# Patient Record
Sex: Female | Born: 1950 | Race: Black or African American | Hispanic: No | Smoking: Never smoker
Health system: Southern US, Community
[De-identification: ages and names within clinical notes are randomized; demographics above are authoritative.]

## PROBLEM LIST (undated history)

## (undated) DIAGNOSIS — M1990 Unspecified osteoarthritis, unspecified site: Secondary | ICD-10-CM

## (undated) DIAGNOSIS — J45909 Unspecified asthma, uncomplicated: Secondary | ICD-10-CM

## (undated) DIAGNOSIS — L309 Dermatitis, unspecified: Secondary | ICD-10-CM

## (undated) DIAGNOSIS — K219 Gastro-esophageal reflux disease without esophagitis: Secondary | ICD-10-CM

## (undated) DIAGNOSIS — R0602 Shortness of breath: Secondary | ICD-10-CM

## (undated) DIAGNOSIS — I10 Essential (primary) hypertension: Secondary | ICD-10-CM

## (undated) HISTORY — PX: OTHER SURGICAL HISTORY: SHX169

## (undated) HISTORY — PX: ABDOMINAL HYSTERECTOMY: SHX81

## (undated) HISTORY — PX: TONSILLECTOMY: SUR1361

---

## 1997-08-23 ENCOUNTER — Ambulatory Visit (HOSPITAL_COMMUNITY): Admission: RE | Admit: 1997-08-23 | Discharge: 1997-08-23 | Payer: Self-pay | Admitting: Obstetrics and Gynecology

## 1997-08-28 ENCOUNTER — Ambulatory Visit (HOSPITAL_COMMUNITY): Admission: RE | Admit: 1997-08-28 | Discharge: 1997-08-28 | Payer: Self-pay | Admitting: Obstetrics and Gynecology

## 1997-11-22 ENCOUNTER — Emergency Department (HOSPITAL_COMMUNITY): Admission: EM | Admit: 1997-11-22 | Discharge: 1997-11-22 | Payer: Self-pay | Admitting: Internal Medicine

## 1998-08-29 ENCOUNTER — Encounter: Payer: Self-pay | Admitting: Obstetrics and Gynecology

## 1998-08-29 ENCOUNTER — Ambulatory Visit (HOSPITAL_COMMUNITY): Admission: RE | Admit: 1998-08-29 | Discharge: 1998-08-29 | Payer: Self-pay | Admitting: Obstetrics and Gynecology

## 1998-10-14 ENCOUNTER — Emergency Department (HOSPITAL_COMMUNITY): Admission: EM | Admit: 1998-10-14 | Discharge: 1998-10-14 | Payer: Self-pay | Admitting: Emergency Medicine

## 1999-08-28 ENCOUNTER — Ambulatory Visit (HOSPITAL_COMMUNITY): Admission: RE | Admit: 1999-08-28 | Discharge: 1999-08-28 | Payer: Self-pay | Admitting: Obstetrics and Gynecology

## 1999-08-28 ENCOUNTER — Encounter: Payer: Self-pay | Admitting: Obstetrics and Gynecology

## 1999-09-10 ENCOUNTER — Other Ambulatory Visit: Admission: RE | Admit: 1999-09-10 | Discharge: 1999-09-10 | Payer: Self-pay | Admitting: Obstetrics and Gynecology

## 2000-10-04 ENCOUNTER — Ambulatory Visit (HOSPITAL_COMMUNITY): Admission: RE | Admit: 2000-10-04 | Discharge: 2000-10-04 | Payer: Self-pay | Admitting: Obstetrics and Gynecology

## 2000-10-04 ENCOUNTER — Encounter: Payer: Self-pay | Admitting: Obstetrics and Gynecology

## 2000-11-12 ENCOUNTER — Other Ambulatory Visit: Admission: RE | Admit: 2000-11-12 | Discharge: 2000-11-12 | Payer: Self-pay | Admitting: Obstetrics and Gynecology

## 2001-10-14 ENCOUNTER — Ambulatory Visit (HOSPITAL_COMMUNITY): Admission: RE | Admit: 2001-10-14 | Discharge: 2001-10-14 | Payer: Self-pay | Admitting: Obstetrics and Gynecology

## 2001-10-14 ENCOUNTER — Encounter: Payer: Self-pay | Admitting: Obstetrics and Gynecology

## 2001-10-21 ENCOUNTER — Emergency Department (HOSPITAL_COMMUNITY): Admission: EM | Admit: 2001-10-21 | Discharge: 2001-10-21 | Payer: Self-pay | Admitting: Emergency Medicine

## 2001-10-21 ENCOUNTER — Encounter: Payer: Self-pay | Admitting: Emergency Medicine

## 2002-01-31 ENCOUNTER — Ambulatory Visit (HOSPITAL_BASED_OUTPATIENT_CLINIC_OR_DEPARTMENT_OTHER): Admission: RE | Admit: 2002-01-31 | Discharge: 2002-01-31 | Payer: Self-pay | Admitting: Orthopedic Surgery

## 2002-02-14 ENCOUNTER — Encounter: Admission: RE | Admit: 2002-02-14 | Discharge: 2002-02-28 | Payer: Self-pay | Admitting: Orthopedic Surgery

## 2002-10-30 ENCOUNTER — Ambulatory Visit (HOSPITAL_COMMUNITY): Admission: RE | Admit: 2002-10-30 | Discharge: 2002-10-30 | Payer: Self-pay | Admitting: Obstetrics and Gynecology

## 2002-10-30 ENCOUNTER — Encounter: Payer: Self-pay | Admitting: Obstetrics and Gynecology

## 2003-11-02 ENCOUNTER — Emergency Department (HOSPITAL_COMMUNITY): Admission: EM | Admit: 2003-11-02 | Discharge: 2003-11-03 | Payer: Self-pay | Admitting: Emergency Medicine

## 2003-11-29 ENCOUNTER — Ambulatory Visit (HOSPITAL_COMMUNITY): Admission: RE | Admit: 2003-11-29 | Discharge: 2003-11-29 | Payer: Self-pay | Admitting: Obstetrics and Gynecology

## 2003-12-07 ENCOUNTER — Encounter: Admission: RE | Admit: 2003-12-07 | Discharge: 2003-12-07 | Payer: Self-pay | Admitting: Obstetrics and Gynecology

## 2004-01-11 ENCOUNTER — Emergency Department (HOSPITAL_COMMUNITY): Admission: EM | Admit: 2004-01-11 | Discharge: 2004-01-11 | Payer: Self-pay | Admitting: Emergency Medicine

## 2005-03-10 ENCOUNTER — Ambulatory Visit (HOSPITAL_COMMUNITY): Admission: RE | Admit: 2005-03-10 | Discharge: 2005-03-10 | Payer: Self-pay | Admitting: Internal Medicine

## 2005-09-12 ENCOUNTER — Emergency Department (HOSPITAL_COMMUNITY): Admission: EM | Admit: 2005-09-12 | Discharge: 2005-09-12 | Payer: Self-pay | Admitting: Emergency Medicine

## 2005-10-14 ENCOUNTER — Encounter (INDEPENDENT_AMBULATORY_CARE_PROVIDER_SITE_OTHER): Payer: Self-pay | Admitting: *Deleted

## 2005-10-14 ENCOUNTER — Ambulatory Visit (HOSPITAL_COMMUNITY): Admission: RE | Admit: 2005-10-14 | Discharge: 2005-10-14 | Payer: Self-pay | Admitting: Gastroenterology

## 2006-03-11 ENCOUNTER — Ambulatory Visit (HOSPITAL_COMMUNITY): Admission: RE | Admit: 2006-03-11 | Discharge: 2006-03-11 | Payer: Self-pay | Admitting: Internal Medicine

## 2006-10-25 ENCOUNTER — Emergency Department (HOSPITAL_COMMUNITY): Admission: EM | Admit: 2006-10-25 | Discharge: 2006-10-26 | Payer: Self-pay | Admitting: Emergency Medicine

## 2006-11-13 ENCOUNTER — Emergency Department (HOSPITAL_COMMUNITY): Admission: EM | Admit: 2006-11-13 | Discharge: 2006-11-13 | Payer: Self-pay | Admitting: Emergency Medicine

## 2007-03-15 ENCOUNTER — Ambulatory Visit (HOSPITAL_COMMUNITY): Admission: RE | Admit: 2007-03-15 | Discharge: 2007-03-15 | Payer: Self-pay | Admitting: Internal Medicine

## 2007-08-04 ENCOUNTER — Encounter: Admission: RE | Admit: 2007-08-04 | Discharge: 2007-08-04 | Payer: Self-pay | Admitting: Internal Medicine

## 2008-03-15 ENCOUNTER — Ambulatory Visit (HOSPITAL_COMMUNITY): Admission: RE | Admit: 2008-03-15 | Discharge: 2008-03-15 | Payer: Self-pay | Admitting: Internal Medicine

## 2008-11-19 ENCOUNTER — Emergency Department (HOSPITAL_BASED_OUTPATIENT_CLINIC_OR_DEPARTMENT_OTHER): Admission: EM | Admit: 2008-11-19 | Discharge: 2008-11-19 | Payer: Self-pay | Admitting: Emergency Medicine

## 2008-12-09 ENCOUNTER — Emergency Department (HOSPITAL_COMMUNITY): Admission: EM | Admit: 2008-12-09 | Discharge: 2008-12-09 | Payer: Self-pay | Admitting: Family Medicine

## 2008-12-11 ENCOUNTER — Inpatient Hospital Stay (HOSPITAL_COMMUNITY): Admission: EM | Admit: 2008-12-11 | Discharge: 2008-12-13 | Payer: Self-pay | Admitting: Emergency Medicine

## 2008-12-11 ENCOUNTER — Ambulatory Visit: Payer: Self-pay | Admitting: Cardiovascular Disease

## 2008-12-12 ENCOUNTER — Ambulatory Visit: Payer: Self-pay | Admitting: *Deleted

## 2008-12-12 ENCOUNTER — Encounter (INDEPENDENT_AMBULATORY_CARE_PROVIDER_SITE_OTHER): Payer: Self-pay | Admitting: Internal Medicine

## 2008-12-13 ENCOUNTER — Encounter (INDEPENDENT_AMBULATORY_CARE_PROVIDER_SITE_OTHER): Payer: Self-pay | Admitting: Internal Medicine

## 2008-12-28 ENCOUNTER — Encounter (HOSPITAL_BASED_OUTPATIENT_CLINIC_OR_DEPARTMENT_OTHER): Admission: RE | Admit: 2008-12-28 | Discharge: 2009-01-23 | Payer: Self-pay | Admitting: General Surgery

## 2009-01-08 ENCOUNTER — Encounter: Admission: RE | Admit: 2009-01-08 | Discharge: 2009-01-08 | Payer: Self-pay | Admitting: General Surgery

## 2009-03-22 ENCOUNTER — Ambulatory Visit (HOSPITAL_COMMUNITY): Admission: RE | Admit: 2009-03-22 | Discharge: 2009-03-22 | Payer: Self-pay | Admitting: Internal Medicine

## 2010-03-27 ENCOUNTER — Ambulatory Visit (HOSPITAL_COMMUNITY): Admission: RE | Admit: 2010-03-27 | Discharge: 2010-03-27 | Payer: Self-pay | Admitting: Internal Medicine

## 2010-06-08 ENCOUNTER — Encounter: Payer: Self-pay | Admitting: Obstetrics and Gynecology

## 2010-08-24 LAB — POCT CARDIAC MARKERS
CKMB, poc: 1.6 ng/mL (ref 1.0–8.0)
Myoglobin, poc: 75.9 ng/mL (ref 12–200)
Myoglobin, poc: 79.4 ng/mL (ref 12–200)
Troponin i, poc: <0.05 ng/mL (ref 0.00–0.09)
Troponin i, poc: <0.05 ng/mL (ref 0.00–0.09)
Troponin i, poc: <0.05 ng/mL (ref 0.00–0.09)

## 2010-08-24 LAB — CARDIAC PANEL(CRET KIN+CKTOT+MB+TROPI)
CK, MB: 1.8 ng/mL (ref 0.3–4.0)
CK, MB: 2.5 ng/mL (ref 0.3–4.0)
Relative Index: 1.1 (ref 0.0–2.5)
Relative Index: 1.4 (ref 0.0–2.5)
Total CK: 167 U/L (ref 7–177)
Total CK: 183 U/L — ABNORMAL HIGH (ref 7–177)
Troponin I: 0.02 ng/mL (ref 0.00–0.06)
Troponin I: 0.02 ng/mL (ref 0.00–0.06)

## 2010-08-24 LAB — BASIC METABOLIC PANEL WITH GFR
BUN: 14 mg/dL (ref 6–23)
CO2: 20 meq/L (ref 19–32)
Calcium: 9.6 mg/dL (ref 8.4–10.5)
Chloride: 106 meq/L (ref 96–112)
Creatinine, Ser: 0.98 mg/dL (ref 0.4–1.2)
GFR calc Af Amer: >60 mL/min (ref >60)
GFR calc non Af Amer: 58 mL/min — ABNORMAL LOW (ref >60)
Glucose, Bld: 111 mg/dL — ABNORMAL HIGH (ref 70–99)
Potassium: 5 meq/L (ref 3.5–5.1)
Sodium: 137 meq/L (ref 135–145)

## 2010-08-24 LAB — URINALYSIS, MICROSCOPIC ONLY
Bilirubin Urine: NEGATIVE
Glucose, UA: NEGATIVE mg/dL
Hgb urine dipstick: NEGATIVE
Ketones, ur: NEGATIVE mg/dL
Leukocytes, UA: NEGATIVE
Nitrite: NEGATIVE
Protein, ur: NEGATIVE mg/dL
Specific Gravity, Urine: 1.011 (ref 1.005–1.030)
Urobilinogen, UA: 0.2 mg/dL (ref 0.0–1.0)
pH: 6 (ref 5.0–8.0)

## 2010-08-24 LAB — COMPREHENSIVE METABOLIC PANEL WITH GFR
ALT: 27 U/L (ref 0–35)
AST: 22 U/L (ref 0–37)
Albumin: 3.8 g/dL (ref 3.5–5.2)
Alkaline Phosphatase: 64 U/L (ref 39–117)
BUN: 12 mg/dL (ref 6–23)
CO2: 25 meq/L (ref 19–32)
Calcium: 9.1 mg/dL (ref 8.4–10.5)
Chloride: 107 meq/L (ref 96–112)
Creatinine, Ser: 0.97 mg/dL (ref 0.4–1.2)
GFR calc Af Amer: >60 mL/min (ref >60)
GFR calc non Af Amer: 59 mL/min — ABNORMAL LOW (ref >60)
Glucose, Bld: 152 mg/dL — ABNORMAL HIGH (ref 70–99)
Potassium: 3.5 meq/L (ref 3.5–5.1)
Sodium: 139 meq/L (ref 135–145)
Total Bilirubin: 0.3 mg/dL (ref 0.3–1.2)
Total Protein: 7.3 g/dL (ref 6.0–8.3)

## 2010-08-24 LAB — HEMOGLOBIN A1C
Hgb A1c MFr Bld: 6 % (ref 4.6–6.1)
Mean Plasma Glucose: 126 mg/dL

## 2010-08-24 LAB — LIPID PANEL
Cholesterol: 190 mg/dL (ref 0–200)
HDL: 51 mg/dL (ref >39)
LDL Cholesterol: 110 mg/dL — ABNORMAL HIGH (ref 0–99)
VLDL: 29 mg/dL (ref 0–40)

## 2010-08-24 LAB — CK TOTAL AND CKMB (NOT AT ARMC)
CK, MB: 2.3 ng/mL (ref 0.3–4.0)
Relative Index: 1.3 (ref 0.0–2.5)

## 2010-08-24 LAB — BRAIN NATRIURETIC PEPTIDE: Pro B Natriuretic peptide (BNP): 42.3 pg/mL (ref 0.0–100.0)

## 2010-08-24 LAB — CBC
Platelets: 269 10*3/uL (ref 150–400)
RDW: 16.2 % — ABNORMAL HIGH (ref 11.5–15.5)

## 2010-08-24 LAB — TSH: TSH: 2.05 u[IU]/mL (ref 0.350–4.500)

## 2010-08-24 LAB — TROPONIN I: Troponin I: 0.01 ng/mL (ref 0.00–0.06)

## 2010-08-24 LAB — D-DIMER, QUANTITATIVE: D-Dimer, Quant: 0.99 ug/mL-FEU — ABNORMAL HIGH (ref 0.00–0.48)

## 2010-09-30 NOTE — Assessment & Plan Note (Signed)
[facility]   NAMETIARA, MAULTSBY            ACCOUNT NO.:  [id number]   MEDICAL RECORD NO.:  [id number]      DATE OF BIRTH:  08-26-1950   PHYSICIAN:  Leonie Man, M.D.    VISIT DATE:  12/31/2008                                   OFFICE VISIT   REFERRING PHYSICIAN:  Merlene Laughter. Renae Gloss, MD   PROBLEM:  This is a 60 year old patient presenting with a wounded area  of the left anterior thigh.  The patient complains of severe pruritus  unrelieved by oral and/or topical medications.  She has been scratching  her left leg causing sores and excoriation.  She saw dermatologist who  allegedly performed a biopsy that showed a stasis dermatitis.  She noted  that she has been on oral antipruritic medications, oral and topical  steroids without any significant relief.   The patient is allergic to PENICILLIN, which causes hives and rash.   MEDICAL AND SURGICAL HISTORY:  She is status post left knee arthroscopy,  she has had a left leg wound biopsy, a total abdominal hysterectomy, and  she has controlled hypertension.   CURRENT MEDICATIONS:  1. Doxycycline 100 mg 2 times daily.  2. Amlodipine 5 mg daily.  3. Prednisone was discontinued orally approximately 1 month ago.   SOCIAL HISTORY:  Single black female comes to the clinic unaccompanied.  She speaks [place].  Note, she denies tobacco, alcohol, or illicit drug  use.   REVIEW OF SYSTEMS:  Negative except as outlined above.   PHYSICAL EXAMINATION:  VITAL SIGNS:  Temperature 97, pulse 71,  respirations 21, blood pressure 113/75.  HEENT:  Head is normocephalic.  Pupils round and regular.  Oropharynx is  benign.  NECK:  Eczematous rash over the entire neck.  LUNGS:  Clear to auscultation bilaterally.  HEART:  Regular rate and rhythm without murmurs, rubs, or gallop.  Rhythms heard.  ABDOMEN:  Soft, nontender, nondistended with normoactive bowel sound.  No palpable masses, visceromegaly, or hernias.  EXTREMITIES:  Rash in the right medial thigh which is papular, red, and  pruritic, and questionably eczematous.  On the anterior calf area, there  is excoriation and blacking eschar with evidence of some hemosiderosis.   ASSESSMENT:  1. Venous stasis disease with stasis dermatitis.  2. Eczema.  3. History of hypertension.   PLAN AND DISPOSITION:  1. We will go ahead and get venous Dopplers to rule out greater      saphenous vein and lesser saphenous vein reflux and perforator      incompetence.  2. We will put her on TCA and Unna paste boot.  We will continue      doxycycline.  We will try over-the-counter Benadryl 50 mg q.6 h.      and p.r.n. and at bedtime.  Consideration of SSRIs will be left up      to Dr. Kellie Shropshire, if indeed she wishes to institute this therapy      for the true pruritus.  We will have the patient come back later in      the week to have her Unna boot replaced, if indeed she goes in to      have a venous Doppler study in the coming week.      Luisa Hart  Lurene Shadow, M.D.  Electronically Signed     PB/MEDQ  D:  12/31/2008  T:  01/01/2009  Job:  [id number]   cc:   Merlene Laughter. Renae Gloss, M.D.

## 2010-09-30 NOTE — H&P (Signed)
NAMELELIANA, KONTZ            ACCOUNT NO.:  [id number]   MEDICAL RECORD NO.:  [id number]          PATIENT TYPE:  INP   LOCATION:  0104                         FACILITY:  [facility]   PHYSICIAN:  Peggye Pitt, M.D. DATE OF BIRTH:  25-Jan-1951   DATE OF ADMISSION:  12/11/2008  DATE OF DISCHARGE:                              HISTORY & PHYSICAL   PRIMARY CARE PHYSICIAN:  Dr. Andi Devon.   CHIEF COMPLAINT:  Syncope.   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  Apparently, she was in the a medical ICU today doing some  cleaning when suddenly she felt extremely nauseated and lightheaded and  passed out.  The next thing she remembers is awakening in the emergency  department.  She denies any fever, chills or any extremity weakness.  No  chest pain.  She did feel very short of breath when she became nauseous.  She was recently diagnosed with scabies and was put on hydroxyzine and a  prednisone taper pack.   ALLERGIES:  SHE HAS ALLERGIES TO PENICILLIN WHICH CAUSES HIVES.   PAST MEDICAL HISTORY:  1. Hypertension.  2. Recent history of scabies.   HOME MEDICATIONS:  1. Norvasc 5 mg daily.  2. Hydroxyzine 25 mg every 6 hours as needed for itching.  3. Prednisone taper pack started the day prior to admission.   SOCIAL HISTORY:  She is married and lives with her husband.  No alcohol,  tobacco or illicit drug use.   FAMILY HISTORY:  Noncontributory.   REVIEW OF SYSTEMS:  Negative except as already mentioned in HPI.   PHYSICAL EXAMINATION:  VITAL SIGNS:  Upon admission, blood pressure  162/91, heart rate 101, respirations 20.  O2 sats 97% on 2 liters with a  temperature of 98.5.  GENERAL:  She is alert, awake and oriented x3 in no acute distress.  HEENT:  Normocephalic, atraumatic.  Pupils are equally reactive to light  and accommodation with intact extraocular  movements.  NECK:  Supple.  No JVD, no lymphadenopathy, no bruits, no goiter.  HEART:  Regular rate and rhythm with no murmurs, rubs or gallops.  LUNGS:  Clear to auscultation bilaterally.  ABDOMEN:  Obese, soft, nontender, nondistended with positive bowel  sounds.  EXTREMITIES:  No edema.  Positive pedal pulses.  She does have what  appears to be a chronic venous insufficiency ulcer over her left lower  extremity.  SKIN:  She has multiple papular, excoriated rash over her trunk, groin  area and thighs.  NEUROLOGIC:  Her mental status is intact.  Her cranial nerves II-XII are  intact.  Her muscle strength is 5/5 bilateral and symmetric.  Her DTRs  are 2+ symmetric.  Her sensation is intact to light touch.  Her Babinski  is downgoing bilaterally.  Her finger-to-nose and heel-to-shin are  normal.  She does not have a pronator drift.  I have not ambulated her.   LABS UPON ADMISSION:  Sodium 137, potassium 5.0, chloride 106, bicarb  20, BUN 14, creatinine 0.98 and  glucose of 111.  First set of point of  care markers is negative.  Chest x-ray shows no acute disease.   EKG shows normal sinus rhythm with a rate of about 110, normal axis.  She appears to have some left atrial enlargement.  The EKG machine is  reading anterolateral ST abnormality, however, I do not see ST  depression on her EKG.   ASSESSMENT AND PLAN:  1. For her syncopal event, at this point differential diagnosis is      very broad.  There could be neurologic causes, including vasovagal      syncope or stroke.  We will start with a CT scan of the head.  We      will check carotid Dopplers and 2-D echo.  She may need an MRI      depending on her CT results.  Also possibility is acute coronary      syndrome or some type of arrhythmia, so we will admit her to      telemetry and we will cycle EKGs and cardiac enzymes.  Orthostatic      hypotension is also a possibility.  We will check orthostatic vital      signs.  Further  workup will depend on the results of her pending      studies.  2. For her hypertension, her blood pressure is slightly elevated      today.  We will continue her on her Norvasc.  For prophylaxis while      in the hospital, she will be on Protonix for gastrointestinal      prophylaxis and Lovenox for deep venous thrombosis prophylaxis.      Peggye Pitt, M.D.  Electronically Signed     EH/MEDQ  D:  12/11/2008  T:  12/11/2008  Job:  [id number]   cc:   Merlene Laughter. Renae Gloss, M.D.  Fax: [phone]

## 2010-09-30 NOTE — Discharge Summary (Signed)
NAMESHADAE, REINO            ACCOUNT NO.:  [id number]   MEDICAL RECORD NO.:  [id number]          PATIENT TYPE:  INP   LOCATION:  1407                         FACILITY:  [facility]   PHYSICIAN:  Marcellus Scott, MD     DATE OF BIRTH:  06-21-1950   DATE OF ADMISSION:  12/11/2008  DATE OF DISCHARGE:  12/13/2008                               DISCHARGE SUMMARY   PRIMARY MEDICAL DOCTOR:  Dr. Andi Devon.   DISCHARGE DIAGNOSES:  1. Syncope, ? vasovagal.  2. Dyspnea, ? secondary to anxiety.  3. Hypertension.  4. Asthma, stable.  5. Scabies.  6. Pulmonary nodules.  Outpatient follow-up as deemed necessary.   DISCHARGE MEDICATIONS:  1. Enteric-coated aspirin 81 mg p.o. daily.  2. Prednisone 20 mg p.o. on December 14, 2008, then 10 mg p.o. on December 15, 2008, then stop.  3. Hydroxyzine 25 mg p.o. q. 6 hourly as needed for itching.  4. Albuterol inhaler 2 puffs inhaled p.r.n.  5. Norvasc 5 mg p.o. daily.   DISCONTINUED MEDICATIONS/WITHHELD:  Prednisolone.   PROCEDURES:  1. MRI of the brain without contrast, impression:      a.     No acute intracranial abnormality.      b.     Chronic ischemic sequela to the right superior frontal       gratis, pattern suggestive of right MCA/ACA watershed ischemia,       see elsewhere the brain is within normal limits for age.  2. CT angiogram of the chest with contrast, impression:  No evidence      of pulmonary embolism.  Small hiatal hernia.  Calcified granuloma,      right lower lobe with additional tiny non-calcified right upper      lobe pulmonary nodules.  These are indeterminate in etiology and      require follow-up.  The radiologist recommended that if the patient      is at high risk for bronchogenic carcinoma, follow-up chest x-ray      recommended in 6-12 months.  If the patient is at low risk, then      follow-up CT in 12 months recommended.   1. A CT of the head without contrast, impression:  Negative.  2. Chest x-ray:  No acute  body of cardiopulmonary disease.  3. A 2-D echocardiogram, conclusions:      a.     Left ventricle cavity size was normal.  Wall thickness was       normal.  Systolic function was normal.  Estimated ejection       fraction was in the range of 55-65%.      b.     Aortic valve.  There was very mild stenosis.  Valve 81.5       cm2.      c.     Atrial septum - no defect or patent foraminal valve was       identified.  4. Carotid Dopplers, which showed no internal carotid artery stenosis      bilaterally.   PERTINENT LABS:  D-dimer 0.99.  CBC:  Hemoglobin  13, hematocrit 38.9,  white blood cell 11.9, platelets 269.  Cardiac enzymes cycled and  negative for acute coronary syndrome.  TSH 2.050.  Urinalysis not  suggestive of urinary tract infection.  Hemoglobin A1c 6.  Comprehensive  metabolic panel with glucose of 152, otherwise unremarkable.  BUN 12,  creatinine 0.97.  Lipid panel, except for LDL of 110, was within normal  limits.  BNP 42.3.   CONSULTATIONS:  None.   HOSPITAL COURSE/PATIENT DISPOSITION:  Ms. Roehm is a very pleasant 60-  year-old [place] American female patient.  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  The next thing she remembered was waking up in the  emergency department.  She had some shortness of breath when she became  nauseous.   The patient was admitted for further evaluation and management.   #1.  Syncope.  The patient was admitted to Telemetry.  She remained in  sinus rhythm without any arrhythmia alarms.  Preliminary workup  including carotid Dopplers, echocardiogram and CT of the head were  negative.  Yesterday on interviewing her, she indicated that she had  dizziness both on standing, greater than lying, transient mild vertigo  and headache and nausea.  To rule out posterior circulation stroke, an  MRI was  requested and a posterior circulation CVA was ruled out.  However, it does show some chronic ischemic changes.  Aspirin will hence  be continued.  Today, the patient indicates she is feeling much better  and she indicates she is almost 99% better with no further dyspnea or  vertigo or headache.  She has occasional mild dizziness.  She is  ambulating.  Physical Therapy and Occupational Therapy did not see any  home needs.  The etiology of this could be possibly vasovagal.  Orthostatic blood pressures were checked and negative.   #2.  Dyspnea probably secondary to anxiety.  However, the patient gave a  history of left ankle swelling and pain almost a week ago.  D-dimer was  positive.  This was followed with a CT angiogram of the chest and  pulmonary embolism was ruled out.  She denies any dyspnea for the last  36 hours. Incidental pulmonary nodules were found on CT which need to be  followed as an outpatient as deemed necessary.  #3.  Hypertension.  This needs tighter control as an outpatient.  Her  blood pressure fluctuated between 120s to 150s/70s-80s mmHg.  #4.  Asthma stable.  The patient utilizes as-needed albuterol inhaler  and this is occasional.  #5.  Scabies.  The patient will complete the steroid taper.  She is to  complete the Prometrium cream.  She is to follow-up with her primary MD.  #6.  Left lower extremity chronic numbness and tingling with associated  chronic shin lesions. Although all these findings appear chronic, she  did have some complaints of pain and hence we will obtain lower  extremity venous Dopplers and if these are negative, the patient will be  discharged home today.   The patient has been advised to seek immediate at medical attention if  there is any deterioration of her condition.  She has been advised to  return to work on Monday, December 17, 2008 if she has returned to her  baseline physical  status.  She is advised to follow-up with her primary MD in  the next 5-7  days.  All of her care has been explained to her daughter who is at the  bedside.  They verbalize understanding.   Time taken in coordinating this discharge is 35 minutes.      Marcellus Scott, MD  Electronically Signed     AH/MEDQ  D:  12/13/2008  T:  12/13/2008  Job:  [id number]   cc:   Merlene Laughter. Renae Gloss, M.D.  Fax: [phone]

## 2010-09-30 NOTE — H&P (Signed)
NAMEJANELIS, STELZER NO.:  [id number]   MEDICAL RECORD NO.:  [id number]          PATIENT TYPE:  INP   LOCATION:  0104                         [facility]ACILITY:  [facility]   PHYSICIAN:  Peggye Pitt, M.D. DATE OF BIRTH:  10/19/1950   DATE OF ADMISSION:  12/11/2008  DATE OF DISCHARGE:                              HISTORY & PHYSICAL   ADDENDUM:  This is to follow her assessment and plan:  State that she  has also been recently diagnosed with scabies.  We will give her  Permethrin cream 5%.  Will apply, leave on for 14 hours.  Will also do  hydroxyzine as needed for itching and will continue her prednisone dose  pack.      Peggye Pitt, M.D.  Electronically Signed     EH/MEDQ  D:  12/11/2008  T:  12/11/2008  Job:  [id number]

## 2010-09-30 NOTE — Assessment & Plan Note (Signed)
[facility]   NAME:  Katrina Gardner, Katrina Gardner            ACCOUNT NO.:  [id number]   MEDICAL RECORD NO.:  [id number]      DATE OF BIRTH:  [birth year]   PHYSICIAN:  Leonie Man, M.D.    VISIT DATE:  01/08/2009                                   OFFICE VISIT   PROBLEM:  This patient is a 60 year old woman presenting with an area of  dermatitis on the anterior aspect of the left leg, which we saw on  December 31, 2008.  She also has an associated severe eczema over her  chest and right medial thigh.  It appeared that this might have a  component of venous stasis.  She was then consequently treated with  compression therapy using an Radio broadcast assistant.  She was then sent for a Duplex  venous Doppler studies.  This did not show any evidence of DVT or any  evidence of greater saphenous or lesser saphenous vein insufficiency or  reflux or incompetence of any perforators.  She returns today for  reevaluation.   On examination, the wounded area is now healed.  There is no evidence of  edema.  We will continue her simply on Bag Balm and moisturize it for  her leg.  We will go ahead and discharge from followup from the wound  care center at this time.  I would recommend that she contact a  dermatologist for continued care of her eczema.      Leonie Man, M.D.  Electronically Signed     PB/MEDQ  D:  01/08/2009  T:  01/09/2009  Job:  [id number]

## 2010-10-03 NOTE — Op Note (Signed)
Katrina Gardner, Katrina Gardner            ACCOUNT NO.:  [id number]   MEDICAL RECORD NO.:  [id number]          PATIENT TYPE:  AMB   LOCATION:  ENDO                         FACILITY:  [facility]   PHYSICIAN:  Anselmo Rod, M.D.  DATE OF BIRTH:  Jun 25, 1950   DATE OF PROCEDURE:  10/14/2005  DATE OF DISCHARGE:                                 OPERATIVE REPORT   PROCEDURE PERFORMED:  Colonoscopy with cold biopsies x2 and snare  colonoscopy x2.   ENDOSCOPIST:  Anselmo Rod, M.D.   INSTRUMENT USED:  Olympus video colonoscope.   INDICATIONS FOR PROCEDURE:  A 60 year old African-American female with a  family history of colon cancer in her mother undergoing a screening  colonoscopy to rule out colonic polyps, masses, etc.   PREPROCEDURE PREPARATION:  Informed consent was procured from the patient.  The patient was fasted for 4 hours prior to the procedure and prepped with  OsmoPrep the night of and the morning of the procedure. Risks and benefits  of the procedure including a 10% miss rate of polyps and cancer was  discussed with the patient as well.   PREPROCEDURE PHYSICAL:  VITAL SIGNS:  The patient has stable vital signs.  NECK:  Supple.  CHEST:  Clear to auscultation. S1 and S2 regular.  ABDOMEN:  Soft with normal bowel sounds.   DESCRIPTION OF PROCEDURE:  The patient was placed in a left lateral  decubitus position and sedated with 100 mcg of fentanyl and 10 mg of Versed  in slow incremental doses. Once the patient was adequately sedated and  maintained on low flow oxygen and continuous cardiac monitoring, the Olympus  video colonoscope was advanced from the rectum to the cecum. The appendiceal  orifice and ileocecal valve were clearly visualized and photographed. Two  small sessile polyps were removed and one biopsied from proximal right colon  (cold biopsy x2).  The rest of the exam was unremarkable. Retroflexion in  the rectum revealed no abnormalities. There was no evidence of  diverticulosis. The patient tolerated the procedure well without immediate  complications. There was some residual stool in the colon, and multiple  washings were done.   IMPRESSION:  Two small sessile polyps removed from the cecal base and one  biopsied from the proximal right colon. Otherwise a normal exam.   RECOMMENDATIONS:  1.  Await pathology results.  2.  Avoid all nonsteroidals including aspirin for the next two weeks.  3.  Repeat colonoscopy depending on pathology results.  4.  Outpatient followup as need arises in the future.      Anselmo Rod, M.D.  Electronically Signed     JNM/MEDQ  D:  10/14/2005  T:  10/14/2005  Job:  [id number]   cc:   Merlene Laughter. Renae Gloss, M.D.  Fax: [phone]

## 2010-10-03 NOTE — Op Note (Signed)
   NAMELEKEYA, ROLLINGS                      ACCOUNT NO.:  [id number]   MEDICAL RECORD NO.:  [id number]                   PATIENT TYPE:  AMB   LOCATION:  DSC                                  FACILITY:  [facility]   PHYSICIAN:  Rodney A. Chaney Malling, M.D.           DATE OF BIRTH:  1950/11/12   DATE OF PROCEDURE:  01/31/2002  DATE OF DISCHARGE:                                 OPERATIVE REPORT   PREOPERATIVE DIAGNOSIS:  Tear of medial meniscus, left knee.   POSTOPERATIVE DIAGNOSIS:  Tear of medial meniscus.   PROCEDURE:  Arthroscopy and debridement of posterior horn medial meniscus,  left knee.   SURGEON:  Lenard Galloway. Chaney Malling, M.D.   ANESTHESIA:  General.   PATHOLOGY:  With the arthroscope in the knee, a very careful examination of  both compartments was undertaken.  The patellofemoral joint appeared fairly  normal.  With the arthroscope in the lateral compartment, the articular  cartilage of the lateral femoral condyle and lateral tibial plateau and the  entire circumference of the lateral meniscus was normal.  The arthroscope  was then passed into the medial compartment.  The articular cartilage of the  medial femoral condyle and medial tibial plateau appeared fairly normal, but  there were multiple horizontal tears of the posterior horn of the medial  meniscus.   DESCRIPTION OF PROCEDURE:  The patient was placed on the operating table in  the supine position with a pneumatic tourniquet about the left upper thigh.  The left leg was placed in the leg holder and the entire left lower  extremity prepped with Duraprep and draped out in the usual manner.  An  infusion cannula was placed in the superior medial pouch and the knee  distended with saline.  Anteromedial and anterolateral portals were made,  and the arthroscope was introduced.  All the pathology seen in the medial  compartment with tears of the posterior horn of the medial meniscus.  The  baskets were inserted and the  posterior horn was extensively debrided.  This  was followed up with the intra-articular shaver, and all debris was removed.  The remainder of the meniscus was smoothly balanced with a nice transition  to the mid-third of the medial meniscus.  The knee was then filled with  Marcaine.  A large bulky compression dressing was applied.  The patient  returned to the recovery room in excellent condition.  Technically this  procedure went extremely well.   FOLLOW-UP CARE:  1. To my office on Monday.  2. Percocet for pain.                                               Rodney A. Chaney Malling, M.D.    RAM/MEDQ  D:  01/31/2002  T:  02/01/2002  Job:  [id number]

## 2011-03-31 ENCOUNTER — Other Ambulatory Visit (HOSPITAL_COMMUNITY): Payer: Self-pay | Admitting: Internal Medicine

## 2011-03-31 DIAGNOSIS — Z1231 Encounter for screening mammogram for malignant neoplasm of breast: Secondary | ICD-10-CM

## 2011-05-14 ENCOUNTER — Ambulatory Visit (HOSPITAL_COMMUNITY): Payer: Self-pay

## 2011-05-14 ENCOUNTER — Ambulatory Visit (HOSPITAL_COMMUNITY)
Admission: RE | Admit: 2011-05-14 | Discharge: 2011-05-14 | Disposition: A | Discharge status: Home / Self Care | Payer: 59 | Source: Ambulatory Visit | Attending: Internal Medicine | Admitting: Internal Medicine

## 2011-05-14 DIAGNOSIS — Z1231 Encounter for screening mammogram for malignant neoplasm of breast: Secondary | ICD-10-CM | POA: Insufficient documentation

## 2012-02-12 ENCOUNTER — Encounter (HOSPITAL_COMMUNITY): Payer: Self-pay | Admitting: Pharmacy Technician

## 2012-02-16 ENCOUNTER — Encounter (HOSPITAL_COMMUNITY)
Admission: RE | Admit: 2012-02-16 | Discharge: 2012-02-16 | Disposition: A | Discharge status: Home / Self Care | Payer: 59 | Source: Ambulatory Visit | Attending: Orthopedic Surgery | Admitting: Orthopedic Surgery

## 2012-02-16 ENCOUNTER — Encounter (HOSPITAL_COMMUNITY): Payer: Self-pay

## 2012-02-16 HISTORY — DX: Unspecified asthma, uncomplicated: J45.909

## 2012-02-16 HISTORY — DX: Unspecified osteoarthritis, unspecified site: M19.90

## 2012-02-16 HISTORY — DX: Gastro-esophageal reflux disease without esophagitis: K21.9

## 2012-02-16 HISTORY — DX: Essential (primary) hypertension: I10

## 2012-02-16 LAB — BASIC METABOLIC PANEL
BUN: 9 mg/dL (ref 6–23)
CO2: 28 mEq/L (ref 19–32)
Calcium: 9.7 mg/dL (ref 8.4–10.5)
Creatinine, Ser: 0.84 mg/dL (ref 0.50–1.10)
GFR calc Af Amer: 85 mL/min — ABNORMAL LOW (ref >90)

## 2012-02-16 LAB — CBC
MCHC: 35 g/dL (ref 30.0–36.0)
MCV: 80.2 fL (ref 78.0–100.0)
Platelets: 277 10*3/uL (ref 150–400)
RDW: 15 % (ref 11.5–15.5)
WBC: 8.4 10*3/uL (ref 4.0–10.5)

## 2012-02-16 LAB — SURGICAL PCR SCREEN
MRSA, PCR: NEGATIVE
Staphylococcus aureus: POSITIVE — AB

## 2012-02-16 LAB — TYPE AND SCREEN

## 2012-02-16 NOTE — Pre-Procedure Instructions (Signed)
20 Katrina Gardner  02/16/2012   Your procedure is scheduled on:  02-18-2012  Report to [facility] at 8:00 AM.  Call this number if you have problems the morning of surgery: [phone]   Remember:   Do not eat food or drink:After Midnight.      Take these medicines the morning of surgery with A SIP OF WATER: Amlodipine(Norvasc)   Do not wear jewelry, make-up or nail polish.  Do not wear lotions, powders, or perfumes. You may wear deodorant.  Do not shave 48 hours prior to surgery. Men may shave face and neck.  Do not bring valuables to the hospital.  Contacts, dentures or bridgework may not be worn into surgery.  Leave suitcase in the car. After surgery it may be brought to your room.    For patients admitted to the hospital, checkout time is 11:00 AM the day of discharge.      Special Instructions: Shower using CHG 2 nights before surgery and the night before surgery.  If you shower the day of surgery use CHG.  Use special wash - you have one bottle of CHG for all showers.  You should use approximately 1/3 of the bottle for each shower.   Please read over the following fact sheets that you were given: Pain Booklet, Coughing and Deep Breathing, Blood Transfusion Information, MRSA Information and Surgical Site Infection Prevention

## 2012-02-16 NOTE — Progress Notes (Signed)
Ralene Bathe PA paged [phone] . Pt. Has penicillin allergy and has ancef ordered pre-op.

## 2012-02-17 MED ORDER — LACTATED RINGERS IV SOLN
INTRAVENOUS | Status: DC
  Administered 2012-02-18 (×2): via INTRAVENOUS

## 2012-02-17 MED ORDER — CHLORHEXIDINE GLUCONATE 4 % EX LIQD: 60.0000 mL | Freq: Once | CUTANEOUS | Status: DC

## 2012-02-17 MED ORDER — CEFAZOLIN SODIUM-DEXTROSE 2-3 GM-% IV SOLR
2.0000 g | INTRAVENOUS | Status: AC
  Administered 2012-02-18: 2 g via INTRAVENOUS
  Filled 2012-02-17: qty 50

## 2012-02-17 NOTE — Consult Note (Signed)
Anesthesia Chart Review:  Patient is a 61 year old female scheduled for right shoulder reversal arthroplasty by Dr. Rennis Chris on 02/18/12. History includes nonsmoker, obesity, hypertension, GERD, arthritis, asthma.  PCP is Dr. Andi Devon who has cleared patient for this procedure.  EKG from [facility] ([facility]) showed NSR.  The date is unclear--? 02/10/10, although last office note stated that an EKG was ordered on 02/09/12.  I've left a message with [facility] asking them to clarify the date of her EKG or refax to Short Stay.  If her EKG is not within the past year, then it will need to be repeated on the day of surgery.  Echo on 12/12/08 showed: 1. Left ventricle: The cavity size was normal. Wall thickness was normal. Systolic function was normal. The estimated ejection fraction was in the range of 55% to 65%. 2. Aortic valve: There was very mild stenosis. Valve area: 1.55cm^2(VTI). Valve area: 1.43cm^2 (Vmax). 3. Atrial septum: No defect or patent foramen ovale was identified.  CXR on 02/16/12 showed no active disease.  Labs noted.  Shonna Chock, PA-C 02/17/12 4401  Update: 02/17/12 1640  I got a message from Bebe Liter, NP confirming that patient was cleared for surgery, but she did not leave the date of the EKG on my voicemail.  I left another message asking their office to refax patient's latest EKG with the date clarified to [phone].  Short Stay staff will have to follow-up in the morning.

## 2012-02-18 ENCOUNTER — Encounter (HOSPITAL_COMMUNITY): Payer: Self-pay | Admitting: *Deleted

## 2012-02-18 ENCOUNTER — Encounter (HOSPITAL_COMMUNITY)
Admission: RE | Disposition: A | Discharge status: Home / Self Care | Payer: Self-pay | Source: Ambulatory Visit | Attending: Orthopedic Surgery

## 2012-02-18 ENCOUNTER — Inpatient Hospital Stay (HOSPITAL_COMMUNITY)
Admission: RE | Admit: 2012-02-18 | Discharge: 2012-02-19 | DRG: 484 | Disposition: A | Discharge status: Home / Self Care | Payer: 59 | Source: Ambulatory Visit | Attending: Orthopedic Surgery | Admitting: Orthopedic Surgery

## 2012-02-18 ENCOUNTER — Encounter (HOSPITAL_COMMUNITY): Payer: Self-pay | Admitting: Vascular Surgery

## 2012-02-18 ENCOUNTER — Ambulatory Visit (HOSPITAL_COMMUNITY): Payer: 59 | Admitting: Vascular Surgery

## 2012-02-18 DIAGNOSIS — K219 Gastro-esophageal reflux disease without esophagitis: Secondary | ICD-10-CM | POA: Diagnosis present

## 2012-02-18 DIAGNOSIS — I10 Essential (primary) hypertension: Secondary | ICD-10-CM

## 2012-02-18 DIAGNOSIS — M719 Bursopathy, unspecified: Principal | ICD-10-CM | POA: Diagnosis present

## 2012-02-18 DIAGNOSIS — Z791 Long term (current) use of non-steroidal anti-inflammatories (NSAID): Secondary | ICD-10-CM

## 2012-02-18 DIAGNOSIS — Z91040 Latex allergy status: Secondary | ICD-10-CM

## 2012-02-18 DIAGNOSIS — M67919 Unspecified disorder of synovium and tendon, unspecified shoulder: Principal | ICD-10-CM | POA: Diagnosis present

## 2012-02-18 DIAGNOSIS — M12819 Other specific arthropathies, not elsewhere classified, unspecified shoulder: Secondary | ICD-10-CM

## 2012-02-18 DIAGNOSIS — M75100 Unspecified rotator cuff tear or rupture of unspecified shoulder, not specified as traumatic: Secondary | ICD-10-CM

## 2012-02-18 DIAGNOSIS — Z880 Allergy status to penicillin: Secondary | ICD-10-CM

## 2012-02-18 HISTORY — DX: Dermatitis, unspecified: L30.9

## 2012-02-18 HISTORY — PX: ROTATOR CUFF REPAIR: SHX139

## 2012-02-18 HISTORY — DX: Shortness of breath: R06.02

## 2012-02-18 HISTORY — PX: REVERSE SHOULDER ARTHROPLASTY: SHX5054

## 2012-02-18 SURGERY — ARTHROPLASTY, SHOULDER, TOTAL, REVERSE
Anesthesia: General | Site: Shoulder | Laterality: Right | Wound class: Clean

## 2012-02-18 MED ORDER — TEMAZEPAM 15 MG PO CAPS: 15.0000 mg | ORAL_CAPSULE | Freq: Every evening | ORAL | Status: DC | PRN

## 2012-02-18 MED ORDER — DOCUSATE SODIUM 100 MG PO CAPS
100.0000 mg | ORAL_CAPSULE | Freq: Two times a day (BID) | ORAL | Status: DC
  Administered 2012-02-18 – 2012-02-19 (×3): 100 mg via ORAL
  Filled 2012-02-18 (×4): qty 1

## 2012-02-18 MED ORDER — ONDANSETRON HCL 4 MG/2ML IJ SOLN
4.0000 mg | Freq: Four times a day (QID) | INTRAMUSCULAR | Status: DC | PRN
  Administered 2012-02-19: 4 mg via INTRAVENOUS
  Filled 2012-02-18: qty 2

## 2012-02-18 MED ORDER — MUPIROCIN 2 % EX OINT
TOPICAL_OINTMENT | Freq: Two times a day (BID) | CUTANEOUS | Status: DC
  Administered 2012-02-18 – 2012-02-19 (×3): via NASAL
  Filled 2012-02-18: qty 22

## 2012-02-18 MED ORDER — PROPOFOL 10 MG/ML IV BOLUS
INTRAVENOUS | Status: DC | PRN
  Administered 2012-02-18: 140 mg via INTRAVENOUS

## 2012-02-18 MED ORDER — MIDAZOLAM HCL 2 MG/2ML IJ SOLN
2.0000 mg | INTRAMUSCULAR | Status: DC | PRN
  Administered 2012-02-18: 2 mg via INTRAVENOUS

## 2012-02-18 MED ORDER — NEOSTIGMINE METHYLSULFATE 1 MG/ML IJ SOLN
INTRAMUSCULAR | Status: DC | PRN
  Administered 2012-02-18: 2 mg via INTRAVENOUS

## 2012-02-18 MED ORDER — ALUM & MAG HYDROXIDE-SIMETH 200-200-20 MG/5ML PO SUSP: 30.0000 mL | ORAL | Status: DC | PRN

## 2012-02-18 MED ORDER — ONDANSETRON HCL 4 MG/2ML IJ SOLN
INTRAMUSCULAR | Status: DC | PRN
  Administered 2012-02-18: 4 mg via INTRAVENOUS

## 2012-02-18 MED ORDER — ACETAMINOPHEN 650 MG RE SUPP: 650.0000 mg | Freq: Four times a day (QID) | RECTAL | Status: DC | PRN

## 2012-02-18 MED ORDER — INFLUENZA VIRUS VACC SPLIT PF IM SUSP
0.5000 mL | INTRAMUSCULAR | Status: AC
  Administered 2012-02-19: 0.5 mL via INTRAMUSCULAR
  Filled 2012-02-18: qty 0.5

## 2012-02-18 MED ORDER — PHENYLEPHRINE HCL 10 MG/ML IJ SOLN
INTRAMUSCULAR | Status: DC | PRN
  Administered 2012-02-18 (×3): 80 ug via INTRAVENOUS

## 2012-02-18 MED ORDER — HYDROMORPHONE HCL PF 1 MG/ML IJ SOLN: 0.2500 mg | INTRAMUSCULAR | Status: DC | PRN

## 2012-02-18 MED ORDER — FENTANYL CITRATE 0.05 MG/ML IJ SOLN
100.0000 ug | Freq: Once | INTRAMUSCULAR | Status: AC
  Administered 2012-02-18: 100 ug via INTRAVENOUS

## 2012-02-18 MED ORDER — ROCURONIUM BROMIDE 100 MG/10ML IV SOLN
INTRAVENOUS | Status: DC | PRN
  Administered 2012-02-18: 40 mg via INTRAVENOUS

## 2012-02-18 MED ORDER — MENTHOL 3 MG MT LOZG
1.0000 | LOZENGE | OROMUCOSAL | Status: DC | PRN
  Administered 2012-02-19: 3 mg via ORAL
  Filled 2012-02-18 (×2): qty 9

## 2012-02-18 MED ORDER — CEFAZOLIN SODIUM 1-5 GM-% IV SOLN
1.0000 g | Freq: Four times a day (QID) | INTRAVENOUS | Status: AC
  Administered 2012-02-18 – 2012-02-19 (×3): 1 g via INTRAVENOUS
  Filled 2012-02-18 (×5): qty 50

## 2012-02-18 MED ORDER — FENTANYL CITRATE 0.05 MG/ML IJ SOLN
INTRAMUSCULAR | Status: DC | PRN
  Administered 2012-02-18 (×2): 100 ug via INTRAVENOUS

## 2012-02-18 MED ORDER — ACETAMINOPHEN 10 MG/ML IV SOLN: 1000.0000 mg | Freq: Once | INTRAVENOUS | Status: DC | PRN

## 2012-02-18 MED ORDER — MIDAZOLAM HCL 2 MG/2ML IJ SOLN
INTRAMUSCULAR | Status: AC
  Filled 2012-02-18: qty 2

## 2012-02-18 MED ORDER — DIPHENHYDRAMINE HCL 12.5 MG/5ML PO ELIX
12.5000 mg | ORAL_SOLUTION | ORAL | Status: DC | PRN
Start: 2012-02-18 — End: 2012-02-19

## 2012-02-18 MED ORDER — ONDANSETRON HCL 4 MG PO TABS: 4.0000 mg | ORAL_TABLET | Freq: Four times a day (QID) | ORAL | Status: DC | PRN

## 2012-02-18 MED ORDER — DEXAMETHASONE SODIUM PHOSPHATE 4 MG/ML IJ SOLN
INTRAMUSCULAR | Status: DC | PRN
  Administered 2012-02-18: 4 mg via INTRAVENOUS

## 2012-02-18 MED ORDER — PHENOL 1.4 % MT LIQD: 1.0000 | OROMUCOSAL | Status: DC | PRN

## 2012-02-18 MED ORDER — KETOROLAC TROMETHAMINE 15 MG/ML IJ SOLN
15.0000 mg | Freq: Four times a day (QID) | INTRAMUSCULAR | Status: DC
  Administered 2012-02-18 – 2012-02-19 (×5): 15 mg via INTRAVENOUS
  Filled 2012-02-18 (×10): qty 1

## 2012-02-18 MED ORDER — FENTANYL CITRATE 0.05 MG/ML IJ SOLN: 100.0000 ug | INTRAMUSCULAR | Status: DC | PRN

## 2012-02-18 MED ORDER — KETOROLAC TROMETHAMINE 30 MG/ML IJ SOLN
INTRAMUSCULAR | Status: AC
  Filled 2012-02-18: qty 1

## 2012-02-18 MED ORDER — ONDANSETRON HCL 4 MG/2ML IJ SOLN: 4.0000 mg | Freq: Once | INTRAMUSCULAR | Status: DC | PRN

## 2012-02-18 MED ORDER — POLYETHYLENE GLYCOL 3350 17 G PO PACK: 17.0000 g | PACK | Freq: Every day | ORAL | Status: DC | PRN

## 2012-02-18 MED ORDER — LIDOCAINE HCL 4 % MT SOLN
OROMUCOSAL | Status: DC | PRN
  Administered 2012-02-18: 4 mL via TOPICAL

## 2012-02-18 MED ORDER — OXYCODONE-ACETAMINOPHEN 5-325 MG PO TABS
1.0000 | ORAL_TABLET | ORAL | Status: DC | PRN
  Administered 2012-02-19 (×2): 2 via ORAL
  Filled 2012-02-18 (×2): qty 2

## 2012-02-18 MED ORDER — EPHEDRINE SULFATE 50 MG/ML IJ SOLN
INTRAMUSCULAR | Status: DC | PRN
  Administered 2012-02-18 (×2): 10 mg via INTRAVENOUS

## 2012-02-18 MED ORDER — DIPHENHYDRAMINE HCL 50 MG/ML IJ SOLN
INTRAMUSCULAR | Status: DC | PRN
  Administered 2012-02-18: 50 mg via INTRAVENOUS

## 2012-02-18 MED ORDER — METOCLOPRAMIDE HCL 5 MG/ML IJ SOLN
5.0000 mg | Freq: Three times a day (TID) | INTRAMUSCULAR | Status: DC | PRN
Start: 2012-02-18 — End: 2012-02-19

## 2012-02-18 MED ORDER — GLYCOPYRROLATE 0.2 MG/ML IJ SOLN
INTRAMUSCULAR | Status: DC | PRN
  Administered 2012-02-18: .4 mg via INTRAVENOUS

## 2012-02-18 MED ORDER — ACETAMINOPHEN 325 MG PO TABS: 650.0000 mg | ORAL_TABLET | Freq: Four times a day (QID) | ORAL | Status: DC | PRN

## 2012-02-18 MED ORDER — METOCLOPRAMIDE HCL 10 MG PO TABS
5.0000 mg | ORAL_TABLET | Freq: Three times a day (TID) | ORAL | Status: DC | PRN
Start: 2012-02-18 — End: 2012-02-19

## 2012-02-18 MED ORDER — HYDROMORPHONE HCL PF 1 MG/ML IJ SOLN
0.5000 mg | INTRAMUSCULAR | Status: DC | PRN
  Administered 2012-02-18 – 2012-02-19 (×3): 1 mg via INTRAVENOUS
  Filled 2012-02-18 (×4): qty 1

## 2012-02-18 MED ORDER — LACTATED RINGERS IV SOLN
INTRAVENOUS | Status: DC
  Administered 2012-02-18 – 2012-02-19 (×2): via INTRAVENOUS

## 2012-02-18 MED ORDER — SODIUM CHLORIDE 0.9 % IR SOLN
Status: DC | PRN
  Administered 2012-02-18: 1000 mL

## 2012-02-18 MED ORDER — AMLODIPINE BESYLATE 5 MG PO TABS
5.0000 mg | ORAL_TABLET | Freq: Every day | ORAL | Status: DC
  Administered 2012-02-19: 5 mg via ORAL
  Filled 2012-02-18 (×2): qty 1

## 2012-02-18 MED ORDER — MIDAZOLAM HCL 5 MG/ML IJ SOLN: 2.0000 mg | Freq: Once | INTRAMUSCULAR | Status: DC

## 2012-02-18 MED ORDER — FENTANYL CITRATE 0.05 MG/ML IJ SOLN
INTRAMUSCULAR | Status: AC
  Filled 2012-02-18: qty 2

## 2012-02-18 MED ORDER — PNEUMOCOCCAL VAC POLYVALENT 25 MCG/0.5ML IJ INJ
0.5000 mL | INJECTION | INTRAMUSCULAR | Status: AC
  Administered 2012-02-19: 0.5 mL via INTRAMUSCULAR
  Filled 2012-02-18: qty 0.5

## 2012-02-18 SURGICAL SUPPLY — 81 items
BIT DRILL 170X2.5X (BIT) IMPLANT
BIT DRL 170X2.5X (BIT)
BLADE SAW SGTL 83.5X18.5 (BLADE) ×2 IMPLANT
BRUSH FEMORAL CANAL (MISCELLANEOUS) IMPLANT
CEMENT BONE DEPUY (Cement) ×3 IMPLANT
CLOTH BEACON ORANGE TIMEOUT ST (SAFETY) ×2 IMPLANT
CLSR STERI-STRIP ANTIMIC 1/2X4 (GAUZE/BANDAGES/DRESSINGS) ×1 IMPLANT
COVER SURGICAL LIGHT HANDLE (MISCELLANEOUS) ×2 IMPLANT
DRAPE INCISE IOBAN 66X45 STRL (DRAPES) ×2 IMPLANT
DRAPE SURG 17X11 SM STRL (DRAPES) ×2 IMPLANT
DRAPE U-SHAPE 47X51 STRL (DRAPES) ×2 IMPLANT
DRILL 2.5 (BIT)
DRILL BIT 7/64X5 (BIT) ×1 IMPLANT
DRSG AQUACEL AG ADV 3.5X10 (GAUZE/BANDAGES/DRESSINGS) ×1 IMPLANT
DRSG MEPILEX BORDER 4X8 (GAUZE/BANDAGES/DRESSINGS) ×1 IMPLANT
DURAPREP 26ML APPLICATOR (WOUND CARE) ×2 IMPLANT
ELECT BLADE 4.0 EZ CLEAN MEGAD (MISCELLANEOUS)
ELECT CAUTERY BLADE 6.4 (BLADE) ×2 IMPLANT
ELECT REM PT RETURN 9FT ADLT (ELECTROSURGICAL) ×2
ELECTRODE BLDE 4.0 EZ CLN MEGD (MISCELLANEOUS) ×1 IMPLANT
ELECTRODE REM PT RTRN 9FT ADLT (ELECTROSURGICAL) ×1 IMPLANT
FACESHIELD LNG OPTICON STERILE (SAFETY) ×6 IMPLANT
GLOVE BIO SURGEON STRL SZ7.5 (GLOVE) ×1 IMPLANT
GLOVE BIO SURGEON STRL SZ8 (GLOVE) ×1 IMPLANT
GLOVE BIOGEL PI IND STRL 8 (GLOVE) IMPLANT
GLOVE BIOGEL PI INDICATOR 8 (GLOVE) ×1
GLOVE BIOGEL PI ORTHO PRO 7.5 (GLOVE) ×1
GLOVE BIOGEL PI ORTHO PRO SZ7 (GLOVE) ×1
GLOVE EUDERMIC 7 POWDERFREE (GLOVE) ×1 IMPLANT
GLOVE PI ORTHO PRO STRL 7.5 (GLOVE) IMPLANT
GLOVE PI ORTHO PRO STRL SZ7 (GLOVE) IMPLANT
GLOVE SS BIOGEL STRL SZ 7.5 (GLOVE) ×1 IMPLANT
GLOVE SUPERSENSE BIOGEL SZ 7.5 (GLOVE)
GLOVE SURG SS PI 7.0 STRL IVOR (GLOVE) ×1 IMPLANT
GLOVE SURG SS PI 7.5 STRL IVOR (GLOVE) ×1 IMPLANT
GLOVE SURG SS PI 8.0 STRL IVOR (GLOVE) ×1 IMPLANT
GLOVE SURG SS PI 8.5 STRL IVOR (GLOVE) ×1
GLOVE SURG SS PI 8.5 STRL STRW (GLOVE) IMPLANT
GOWN STRL NON-REIN LRG LVL3 (GOWN DISPOSABLE) ×1 IMPLANT
GOWN STRL REIN XL XLG (GOWN DISPOSABLE) ×5 IMPLANT
HANDPIECE INTERPULSE COAX TIP (DISPOSABLE)
KIT BASIN OR (CUSTOM PROCEDURE TRAY) ×2 IMPLANT
KIT ROOM TURNOVER OR (KITS) ×2 IMPLANT
MANIFOLD NEPTUNE II (INSTRUMENTS) ×2 IMPLANT
NDL HYPO 25GX1X1/2 BEV (NEEDLE) IMPLANT
NDL SUT 6 .5 CRC .975X.05 MAYO (NEEDLE) IMPLANT
NEEDLE HYPO 25GX1X1/2 BEV (NEEDLE) IMPLANT
NEEDLE MAYO TAPER (NEEDLE) ×2
NS IRRIG 1000ML POUR BTL (IV SOLUTION) ×2 IMPLANT
PACK SHOULDER (CUSTOM PROCEDURE TRAY) ×2 IMPLANT
PAD ARMBOARD 7.5X6 YLW CONV (MISCELLANEOUS) ×4 IMPLANT
PASSER SUT SWANSON 36MM LOOP (INSTRUMENTS) IMPLANT
PIN GUIDE 1.2 (PIN) IMPLANT
PIN GUIDE GLENOPHERE 1.5MX300M (PIN) IMPLANT
PIN METAGLENE 2.5 (PIN) IMPLANT
PRESSURIZER FEMORAL UNIV (MISCELLANEOUS) IMPLANT
RESTRICTOR CEMENT PE SZ 2 (Cement) ×1 IMPLANT
SET HNDPC FAN SPRY TIP SCT (DISPOSABLE) IMPLANT
SLING ARM FOAM STRAP LRG (SOFTGOODS) ×1 IMPLANT
SLING ARM IMMOBILIZER LRG (SOFTGOODS) ×2 IMPLANT
SPONGE LAP 18X18 X RAY DECT (DISPOSABLE) ×3 IMPLANT
SPONGE LAP 4X18 X RAY DECT (DISPOSABLE) ×1 IMPLANT
STRIP CLOSURE SKIN 1/2X4 (GAUZE/BANDAGES/DRESSINGS) ×1 IMPLANT
SUCTION FRAZIER TIP 10 FR DISP (SUCTIONS) ×2 IMPLANT
SUT BONE WAX W31G (SUTURE) IMPLANT
SUT FIBERWIRE #2 38 T-5 BLUE (SUTURE) ×4
SUT MNCRL AB 3-0 PS2 18 (SUTURE) ×3 IMPLANT
SUT VIC AB 1 CT1 27 (SUTURE) ×2
SUT VIC AB 1 CT1 27XBRD ANBCTR (SUTURE) ×3 IMPLANT
SUT VIC AB 2-0 CT1 27 (SUTURE) ×2
SUT VIC AB 2-0 CT1 TAPERPNT 27 (SUTURE) ×2 IMPLANT
SUT VIC AB 2-0 SH 27 (SUTURE)
SUT VIC AB 2-0 SH 27X BRD (SUTURE) IMPLANT
SUTURE FIBERWR #2 38 T-5 BLUE (SUTURE) IMPLANT
SYR 30ML SLIP (SYRINGE) ×1 IMPLANT
SYR CONTROL 10ML LL (SYRINGE) IMPLANT
TOWEL OR 17X24 6PK STRL BLUE (TOWEL DISPOSABLE) ×2 IMPLANT
TOWEL OR 17X26 10 PK STRL BLUE (TOWEL DISPOSABLE) ×2 IMPLANT
TOWER CARTRIDGE SMART MIX (DISPOSABLE) ×1 IMPLANT
TRAY FOLEY CATH 14FR (SET/KITS/TRAYS/PACK) IMPLANT
WATER STERILE IRR 1000ML POUR (IV SOLUTION) ×2 IMPLANT

## 2012-02-18 NOTE — Op Note (Signed)
02/18/2012  12:39 PM  PATIENT:   Katrina Gardner  61 y.o. female  PRE-OPERATIVE DIAGNOSIS:  right shoulder rotator cuff arthropathy   POST-OPERATIVE DIAGNOSIS:  same  PROCEDURE:  Right shoulder reverse arthroplasty  SURGEON:  Deaisha Welborn, Vania Rea. M.D.  ASSISTANTS: Shuford pac   ANESTHESIA:   GET + ISB  EBL: 200  SPECIMEN:  nine  Drains: nine   PATIENT DISPOSITION:  PACU - hemodynamically stable.    PLAN OF CARE: Admit to inpatient   Dictation# [id number]

## 2012-02-18 NOTE — H&P (Signed)
Danford Bad    Chief Complaint: right shoulder rotator cuff arthropathy  HPI: The patient is a 61 y.o. female with end stage right shoulder rotator cuff tear arthropathy  Past Medical History  Diagnosis Date  . Hypertension   . Asthma   . GERD (gastroesophageal reflux disease)     tums  . Arthritis   . Eczema     Past Surgical History  Procedure Date  . Abdominal hysterectomy   . Arthroscopic knee     left  . Tonsillectomy     History reviewed. No pertinent family history.  Social History:  reports that she has never smoked. She does not have any smokeless tobacco history on file. She reports that she does not drink alcohol or use illicit drugs.  Allergies:  Allergies  Allergen Reactions  . Penicillins Hives and Itching  . Latex Rash    Medications Prior to Admission  Medication Sig Dispense Refill  . amLODipine (NORVASC) 5 MG tablet Take 5 mg by mouth daily.      . mupirocin ointment (BACTROBAN) 2 % Place into the nose 2 (two) times daily. Last dose am of 02/22/12 for positive PCR      . ibuprofen (ADVIL,MOTRIN) 800 MG tablet Take 800 mg by mouth every 8 (eight) hours as needed. For pain      . UNKNOWN TO PATIENT Eczema cream         Physical Exam: right shoulder with painful and restricted ROM as noted at9/10/13 office visit  Vitals  Temp:  [98.2 F (36.8 C)] 98.2 F (36.8 C) (10/03 0823) Pulse Rate:  [76] 76  (10/03 0823) Resp:  [18] 18  (10/03 0823) BP: (123)/(83) 123/83 mmHg (10/03 0823) SpO2:  [98 %] 98 % (10/03 0823)  Assessment/Plan  Impression: right shoulder rotator cuff arthropathy   Plan of Action: Procedure(s): REVERSE SHOULDER ARTHROPLASTY  Gracy Ehly M 02/18/2012, 9:53 AM

## 2012-02-18 NOTE — Plan of Care (Signed)
Problem: Consults Goal: Diagnosis- Total Joint Replacement R Reverse Total Shoulder

## 2012-02-18 NOTE — Anesthesia Procedure Notes (Addendum)
Anesthesia Regional Block:  Interscalene brachial plexus block  Pre-Anesthetic Checklist: ,, timeout performed, Correct Patient, Correct Site, Correct Laterality, Correct Procedure, Correct Position, site marked, Risks and benefits discussed,  Surgical consent,  Pre-op evaluation,  At surgeon's request and post-op pain management  Laterality: Right  Prep: chloraprep       Needles:   Needle Type: Echogenic Stimulator Needle      Needle Gauge: 22 and 22 G    Additional Needles:  Procedures: ultrasound guided and nerve stimulator Interscalene brachial plexus block Narrative:  Start time: 02/18/2012 9:50 AM End time: 02/18/2012 10:00 AM  Performed by: Personally   Additional Notes: 30 cc 0.5% marcaine with 1:200 Epi injected easily.  Kipp Brood, MD   Procedure Name: Intubation Date/Time: 02/18/2012 10:53 AM Performed by: Marena Chancy Pre-anesthesia Checklist: Patient identified, Timeout performed, Emergency Drugs available, Suction available and Patient being monitored Patient Re-evaluated:Patient Re-evaluated prior to inductionOxygen Delivery Method: Circle system utilized Preoxygenation: Pre-oxygenation with 100% oxygen Intubation Type: IV induction Ventilation: Mask ventilation without difficulty and Oral airway inserted - appropriate to patient size Laryngoscope Size: Hyacinth Meeker and 2 Grade View: Grade II Tube type: Oral Tube size: 7.0 mm Number of attempts: 1 Airway Equipment and Method: LTA kit utilized Placement Confirmation: ETT inserted through vocal cords under direct vision,  breath sounds checked- equal and bilateral and positive ETCO2 Secured at: 22 cm Tube secured with: Tape Dental Injury: Teeth and Oropharynx as per pre-operative assessment

## 2012-02-18 NOTE — Anesthesia Preprocedure Evaluation (Signed)
Anesthesia Evaluation  Patient identified by MRN, date of birth, ID band Patient awake    Reviewed: Allergy & Precautions, H&P , NPO status , Patient's Chart, lab work & pertinent test results  Airway Mallampati: II TM Distance: >3 FB Neck ROM: Full    Dental  (+) Teeth Intact, Poor Dentition and Dental Advisory Given   Pulmonary  breath sounds clear to auscultation        Cardiovascular Rhythm:Regular Rate:Normal     Neuro/Psych    GI/Hepatic   Endo/Other    Renal/GU      Musculoskeletal   Abdominal   Peds  Hematology   Anesthesia Other Findings   Reproductive/Obstetrics                           Anesthesia Physical Anesthesia Plan  ASA: III  Anesthesia Plan: General   Post-op Pain Management:    Induction: Intravenous  Airway Management Planned: Oral ETT  Additional Equipment:   Intra-op Plan:   Post-operative Plan: Extubation in OR  Informed Consent: I have reviewed the patients History and Physical, chart, labs and discussed the procedure including the risks, benefits and alternatives for the proposed anesthesia with the patient or authorized representative who has indicated his/her understanding and acceptance.   Dental advisory given  Plan Discussed with: CRNA and Surgeon  Anesthesia Plan Comments: (DJD R. Shoulder Htn GERD  Plan GA with interscalene block)        Anesthesia Quick Evaluation

## 2012-02-18 NOTE — Preoperative (Signed)
Beta Blockers   Reason not to administer Beta Blockers:Not Applicable 

## 2012-02-18 NOTE — Anesthesia Postprocedure Evaluation (Signed)
  Anesthesia Post-op Note  Patient: Katrina Gardner  Procedure(s) Performed: Procedure(s) (LRB) with comments: REVERSE SHOULDER ARTHROPLASTY (Right) - Right Reverse Shoulder Arthroplasty   Patient Location: PACU  Anesthesia Type: General and GA combined with regional for post-op pain  Level of Consciousness: awake, alert  and oriented  Airway and Oxygen Therapy: Patient Spontanous Breathing and Patient connected to nasal cannula oxygen  Post-op Pain: none  Post-op Assessment: Post-op Vital signs reviewed and Patient's Cardiovascular Status Stable  Post-op Vital Signs: stable  Complications: No apparent anesthesia complications

## 2012-02-18 NOTE — Progress Notes (Signed)
Utilization review completed. Manas Hickling, RN, BSN. 

## 2012-02-18 NOTE — Transfer of Care (Signed)
Immediate Anesthesia Transfer of Care Note  Patient: Katrina Gardner  Procedure(s) Performed: Procedure(s) (LRB) with comments: REVERSE SHOULDER ARTHROPLASTY (Right) - Right Reverse Shoulder Arthroplasty   Patient Location: PACU  Anesthesia Type: General  Level of Consciousness: awake, alert  and oriented  Airway & Oxygen Therapy: Patient Spontanous Breathing and Patient connected to nasal cannula oxygen  Post-op Assessment: Report given to PACU RN, Post -op Vital signs reviewed and stable and Patient moving all extremities X 4  Post vital signs: Reviewed and stable  Complications: No apparent anesthesia complications

## 2012-02-18 NOTE — Progress Notes (Signed)
Per Dr. Rennis Chris give Ancef as ordered

## 2012-02-19 DIAGNOSIS — M12819 Other specific arthropathies, not elsewhere classified, unspecified shoulder: Secondary | ICD-10-CM

## 2012-02-19 DIAGNOSIS — M75100 Unspecified rotator cuff tear or rupture of unspecified shoulder, not specified as traumatic: Secondary | ICD-10-CM

## 2012-02-19 DIAGNOSIS — I10 Essential (primary) hypertension: Secondary | ICD-10-CM

## 2012-02-19 MED ORDER — HYDROMORPHONE HCL 2 MG PO TABS: 2.0000 mg | ORAL_TABLET | ORAL | Status: DC | PRN

## 2012-02-19 MED ORDER — CYCLOBENZAPRINE HCL 5 MG PO TABS
5.0000 mg | ORAL_TABLET | Freq: Once | ORAL | Status: AC
  Administered 2012-02-19: 5 mg via ORAL
  Filled 2012-02-19: qty 1

## 2012-02-19 NOTE — Progress Notes (Signed)
I agree with the following treatment note after reviewing documentation.   Johnston, Jahnaya Branscome Brynn   OTR/L Pager: [phone] Office: [phone] .   

## 2012-02-19 NOTE — Discharge Summary (Signed)
HomePATIENT ID:      Katrina Gardner  MRN:     [id number] DOB/AGE:    62-Mar-1952 / 62 y.o.     DISCHARGE SUMMARY  ADMISSION DATE:    02/18/2012 DISCHARGE DATE:    ADMISSION DIAGNOSIS: right shoulder rotator cuff arthropathy  Past Medical History  Diagnosis Date  . Hypertension   . Asthma   . GERD (gastroesophageal reflux disease)     tums  . Arthritis   . Eczema   . Shortness of breath     DISCHARGE DIAGNOSIS:   Principal Problem:  *Rotator cuff tear arthropathy Active Problems:  Hypertension   PROCEDURE: Procedure(s): REVERSE SHOULDER ARTHROPLASTY on 02/18/2012  CONSULTS:  none   HISTORY:  See H&P in chart.  HOSPITAL COURSE:  Katrina Gardner is a 61 y.o. admitted on 02/18/2012 with a chief complaint of Right shoulder pain, and found to have a diagnosis of right shoulder rotator cuff arthropathy .  They were brought to the operating room on 02/18/2012 and underwent Procedure(s): REVERSE SHOULDER ARTHROPLASTY.    They were given perioperative antibiotics: Anti-infectives     Start     Dose/Rate Route Frequency Ordered Stop   02/18/12 1530   ceFAZolin (ANCEF) IVPB 1 g/50 mL premix        1 g 100 mL/hr over 30 Minutes Intravenous Every 6 hours 02/18/12 1517 02/19/12 0802   02/17/12 1429   ceFAZolin (ANCEF) IVPB 2 g/50 mL premix        2 g 100 mL/hr over 30 Minutes Intravenous 60 min pre-op 02/17/12 1430 02/18/12 1058        .  Patient underwent the above named procedure and tolerated it well. The following day they were hemodynamically stable and pain was controlled on oral analgesics. They were neurovascularly intact to the operative extremity. OT was ordered and worked with patient per protocol. They were medically and orthopaedically stable for discharge on POD 1     DIAGNOSTIC STUDIES:    RECENT RADIOGRAPHIC STUDIES :  Dg Chest 2 View  02/16/2012  *RADIOLOGY REPORT*  Clinical Data: Preop for right shoulder arthroplasty  CHEST - 2 VIEW  Comparison:  12/12/2008  Findings: Cardiomediastinal silhouette is stable.  No acute infiltrate or pleural effusion.  No pulmonary edema.  Degenerative changes right acromioclavicular joint.  IMPRESSION: No active disease.   Original Report Authenticated By: Natasha Mead, M.D.     RECENT VITAL SIGNS:  Patient Vitals for the past 24 hrs:  BP Temp Temp src Pulse Resp SpO2  02/19/12 0617 155/95 mmHg 98.8 F (37.1 C) - 102  18  95 %  02/18/12 2225 151/93 mmHg 98 F (36.7 C) - 108  18  94 %  02/18/12 1447 - 97.3 F (36.3 C) - - - -  02/18/12 1445 - - - 86  15  99 %  02/18/12 1436 132/72 mmHg - - 86  13  99 %  02/18/12 1430 - - - 87  15  98 %  02/18/12 1421 146/64 mmHg - - 88  19  98 %  02/18/12 1415 - - - 82  17  99 %  02/18/12 1405 136/76 mmHg - - 78  18  99 %  02/18/12 1400 - - - 89  20  99 %  02/18/12 1354 146/76 mmHg - - 88  18  99 %  02/18/12 1345 - - - 91  17  99 %  02/18/12 1335 141/68 mmHg - - 78  16  99 %  02/18/12 1330 - - - 77  16  99 %  02/18/12 1321 141/63 mmHg - - 75  21  99 %  02/18/12 1315 - - - 73  19  99 %  02/18/12 1306 - - - 81  7  99 %  02/18/12 1305 151/73 mmHg 97.1 F (36.2 C) - - - -  02/18/12 1300 146/79 mmHg 98.4 F (36.9 C) Oral 99  16  96 %  02/18/12 0943 - - - 89  18  100 %  02/18/12 0823 123/83 mmHg 98.2 F (36.8 C) Oral 76  18  98 %  .  RECENT EKG RESULTS:   No orders found for this or any previous visit.  DISCHARGE INSTRUCTIONS:    DISCHARGE MEDICATIONS:     Medication List     As of 02/19/2012  8:17 AM    TAKE these medications         amLODipine 5 MG tablet   Commonly known as: NORVASC   Take 5 mg by mouth daily.      HYDROmorphone 2 MG tablet   Commonly known as: DILAUDID   Take 1-2 tablets (2-4 mg total) by mouth every 4 (four) hours as needed for pain.      ibuprofen 800 MG tablet   Commonly known as: ADVIL,MOTRIN   Take 800 mg by mouth every 8 (eight) hours as needed. For pain      mupirocin ointment 2 %   Commonly known as: BACTROBAN    Place into the nose 2 (two) times daily. Last dose am of 02/22/12 for positive PCR      UNKNOWN TO PATIENT   Eczema cream        FOLLOW UP VISIT:       Follow-up Information    Follow up with Senaida Lange, MD. (call to be seen in 10-14 days)    Contact information:   [facility] [address] [place] [address] [phone]          DISCHARGE [id number]:YNWG  DISPOSITION:   DISCHARGE CONDITION:  {Good  Ashiya Kinkead for Dr. Francena Hanly 02/19/2012, 8:17 AM

## 2012-02-19 NOTE — Op Note (Signed)
NAMEDANEY, MOOR NO.:  [id number]  MEDICAL RECORD NO.:  [id number]  LOCATION:  [id number]                        FACILITY:  [facility]  PHYSICIAN:  Vania Rea. Shena Vinluan, M.D.  DATE OF BIRTH:  Sep 30, 1950  DATE OF PROCEDURE:  02/18/2012 DATE OF DISCHARGE:                              OPERATIVE REPORT   PREOPERATIVE DIAGNOSIS:  End-stage right shoulder rotator cuff tear arthropathy.  POSTOPERATIVE DIAGNOSIS:  End-stage right shoulder rotator cuff tear arthropathy.  PROCEDURE:  Right shoulder reverse arthroplasty utilizing a cemented size 10 DePuy stem, +3 poly, and a 38 eccentric glenosphere.  SURGEON:  Vania Rea. Dontavious Emily, M.D.  Threasa HeadsFrench Ana A. Shuford, P.A.-C.  ANESTHESIA:  General endotracheal as well as an interscalene block.  ESTIMATED BLOOD LOSS:  100 mL.  DRAINS:  None.  HISTORY:  Ms. Boch is a 61 year old female who has had chronic and progressive increasing right shoulder pain with weakness and restricted mobility, increasing functional limitations despite prolonged attempts at conservative management.  Radiographs and imaging studies confirmed massive chronic rotator cuff tear.  She is brought to the operating room at this time for planned right shoulder reverse arthroplasty.  Preoperatively, I counseled Ms. Bonaventura on treatment options as well as risks versus benefits thereof.  Possible surgical complications were reviewed including potential for bleeding, infection, neurovascular injury, failure of the implant, anesthetic complication, ongoing pain, and possible need for additional surgery.  She understands and accepts and agrees with our planned procedure.  PROCEDURE IN DETAIL:  After undergoing routine preop evaluation, the patient received prophylactic antibiotics.  An interscalene block was established in the holding area by the Anesthesia Department.  Placed supine on the operative table, underwent smooth induction of a general endotracheal  anesthesia.  Placed into beach-chair position and appropriate padding protected.  Right shoulder girdle region was sterilely prepped and draped in standard fashion.  Time-out was called. An anterior approach to the right shoulder was made through approximately 15 cm deltopectoral incision with skin sharply divided and subcu divided sharply and bluntly with electrocautery used for hemostasis.  The deltopectoral interval was then identified and developed from proximal to distal with cephalic vein protected and taken laterally with the deltoid.  The upper cm of the thick major tendon was divided to improve visualization.  Adhesions in the subdeltoid bursa were divided and self-retaining retractors were placed.  The conjoined tendon was identified, mobilized, retracted medially.  The biceps tendon was identified and the roof of the bicipital groove was released. Biceps tendon was tenotomized for later tenodesis.  There was complete absence of the superior half of the rotator cuff and the greater tuberosity was completely denuded.  The upper subscapularis was also chronically retracted with the biceps tendon being subluxed into the joint.  I divided the subscap away from the lesser tuberosity.  The quality tissue was very atrophied and did not feel it was adequate for later repair, and it was retracted medially.  The humeral head was then delivered through the wound, and we performed sequential reaming of the humeral canal up to size 10 and then using the extramedullary guide performed the humeral head resection at 0 degrees of retroversion.  We then placed a metal cap  across the cut surface of the proximal humerus. Then exposed the glenoid with series of retractors and then performed a circumferential labral resection.  Also resected the superior aspect of the rotator cuff remnant.  This allowed circumferential visualization of the entire glenoid.  A guidepin was then placed in the center of  the glenoid, reamed to subchondral bone.  Central drill hole was then placed and then we impacted the glenoid base plate into position.  We then placed the superior and inferior locking screws and 1 anterior nonlocking screw; all obtaining good bony purchase and locking screws were finally transfixed.  We then placed a 38 eccentric glenosphere over the glenoid base plate, and this was then terminally tightened with good fixation.  We then returned our attention to the proximal humerus where I placed a size 10 stem and reamed size 1 for the metaphyseal region. We then placed a size 10 trial and this showed appropriate soft tissue balance with the +3 poly.  We then dislocated the joint, removed the implants, placed the distal cement plug, irrigated the canal, introduced cement after the canal was meticulously dried and maintained approximately 0 degrees retroversion.  Once the cement hardened, we meticulously removed all extra cement.  We once again performed trial reductions using +3 poly, which showed excellent soft tissue balance. We then impacted the final +3 poly into the humeral stem, performed a final reduction that showed excellent soft tissue balance and good mobility of the shoulder and excellent stability.  At this point, I completed the biceps tenodesis using #2 FiberWire at the subpectoral level.  Wound was then irrigated.  Hemostasis was obtained.  The deltopectoral interval was then reapproximated with 0-Vicryl, 2-0 Vicryl used for the subcu layer, and intracuticular 3-0 Monocryl for the skin followed by Steri-Strips.  Dry dressing applied.  Right arm was placed in a sling.  The patient was awakened, extubated, and taken to the recovery room in stable condition.  Tracey Shuford, PA-C was used as an Geophysicist/field seismologist throughout this case essential for help with positioning of the extremity, retraction, manipulation of soft tissues, and implantation of the prosthesis, wound closure,  and intraoperative decision making.  The patient was awakened, extubated, and taken to recovery room in stable condition.     Vania Rea. Anquanette Bahner, M.D.     KMS/MEDQ  D:  02/18/2012  T:  02/19/2012  Job:  [id number]

## 2012-02-19 NOTE — Progress Notes (Signed)
Discharge instructions discussed with patient and daughter Judeth Cornfield.  Prescription given for Dilaudid PO.  Patient instructed to call for fever, redness of the wound and discharge from the incision.  Do not get the incision wet, dressing change 5 days after surgery.   Patient given pneumonia vaccine, and flu vaccine to left deltoid.  Patient and daughter instructed on how to apply sling to left arm.  Call for return appointment within 10-12 days.  No questions voiced at this time.  Patient and daughter verbalized understanding of all discharge instructions.

## 2012-02-19 NOTE — Progress Notes (Signed)
Occupational Therapy Discharge Patient Details Name: Katrina Gardner MRN: [id number] DOB: 1950-07-07 Today's Date: 02/19/2012 Time: 2956-2130 OT Time Calculation (min): 38 min  Patient discharged from OT services secondary to Pt. educated on shoulder protocol and exercises. Pt. independent in completing exercises. Pt. supervision for dressing and will have daughter to (A) with ADL needs once home.   Please see latest therapy progress note for current level of functioning and progress toward goals.    Progress and discharge plan discussed with patient and/or caregiver: Patient/Caregiver agrees with plan  GO     Cleora Fleet 02/19/2012, 10:07 AM

## 2012-02-19 NOTE — Progress Notes (Signed)
Occupational Therapy Evaluation Patient Details Name: Katrina Gardner MRN: [id number] DOB: [birth year] Today's Date: 02/19/2012 Time: 7829-5621 OT Time Calculation (min): 38 min  OT Assessment / Plan / Recommendation Clinical Impression  Pt. 61 yo female s/p reverse shoulder arthroplasty. Pt. educated on shoulder protocol and exercises. Pt. independent in comleting exercises. Pt. requires supervision to complete UB dressing . Pt. plans to stay with daughter who will (A) with ADL needs once home. No further acute OT needs.     OT Assessment  Progress rehab of shoulder as ordered by MD at follow-up appointment    Follow Up Recommendations  Supervision - Intermittent    Barriers to Discharge      Equipment Recommendations       Recommendations for Other Services    Frequency       Precautions / Restrictions Precautions Required Braces or Orthoses: Other Brace/Splint Restrictions Weight Bearing Restrictions: No   Pertinent Vitals/Pain Pt. Reports no pain, had just received pain meds.     ADL  Grooming: Performed;Teeth care;Supervision/safety Where Assessed - Grooming: Unsupported standing Upper Body Bathing: Performed;Maximal assistance (For RUE) Where Assessed - Upper Body Bathing: Unsupported sitting Upper Body Dressing: Performed;Supervision/safety Where Assessed - Upper Body Dressing: Unsupported sitting Toilet Transfer: Performed;Supervision/safety Toilet Transfer Method: Sit to Barista: Regular height toilet Toileting - Clothing Manipulation and Hygiene: Performed;Minimal assistance Where Assessed - Engineer, mining and Hygiene: Sit to stand from 3-in-1 or toilet Transfers/Ambulation Related to ADLs: Pt. supervision for transfers and ambulation in room.  ADL Comments: Pt. educated on sling wear and is able to verbally instruct how to don and doff sling. Pt. educated on proper dressing technique and is able to complete with  supervision. Pt. comlpeted pendulum exercises and AAROM FF 90 degrees, ER 30 degrees, Abduction 60 degreees with verbal cues for proper technique. Pt. will be living with daughter once d/c and daughter will be able to (A) with any ADL needs.    OT Diagnosis:    OT Problem List:   OT Treatment Interventions:     OT Goals    Visit Information  Last OT Received On: 02/19/12 Assistance Needed: +1    Subjective Data  Subjective: I am not the kind to give up easily. I will do whatever you tell me.  Patient Stated Goal: To get better and go home.    Prior Functioning     Home Living Lives With: Alone Available Help at Discharge: Family Type of Home: House Home Access: Stairs to enter Secretary/administrator of Steps: 5 Home Layout: One level Bathroom Shower/Tub: Network engineer: None Prior Function Level of Independence: Independent Able to Take Stairs?: Yes Driving: Yes Vocation: Full time employment Communication Communication: No difficulties Dominant Hand: Right         Vision/Perception     Cognition  Overall Cognitive Status: Appears within functional limits for tasks assessed/performed Arousal/Alertness: Awake/alert Orientation Level: Appears intact for tasks assessed Behavior During Session: [facility] for tasks performed    Extremity/Trunk Assessment Trunk Assessment Trunk Assessment: Normal     Mobility Bed Mobility Bed Mobility: Supine to Sit;Sit to Supine Supine to Sit: 5: Supervision Sit to Supine: 5: Supervision Transfers Transfers: Sit to Stand;Stand to Sit Sit to Stand: 5: Supervision;From bed;From toilet Stand to Sit: 5: Supervision;To chair/3-in-1;To toilet Details for Transfer Assistance: Supervision for safety     Shoulder Instructions Donning/doffing shirt without moving shoulder: Supervision/safety Method for sponge bathing under operated UE: Maximal  assistance Donning/doffing  sling/immobilizer: Minimal assistance;Patient able to independently direct caregiver Correct positioning of sling/immobilizer: Supervision/safety;Patient able to independently direct caregiver Pendulum exercises (written home exercise program): Independent ROM for elbow, wrist and digits of operated UE: Independent Sling wearing schedule (on at all times/off for ADL's): Independent Proper positioning of operated UE when showering: Independent Positioning of UE while sleeping: Independent   Exercise Shoulder Exercises Pendulum Exercise: AAROM;Right;10 reps;Standing Shoulder Flexion: AAROM;Right;Supine;10 reps Shoulder ABduction: Right;10 reps;Supine;AAROM Shoulder External Rotation: AAROM;Right;10 reps;Supine Elbow Flexion: AROM;Seated Elbow Extension: AROM;Seated Wrist Flexion: AROM;Seated Wrist Extension: AROM;Seated Digit Composite Flexion: AROM;Seated Composite Extension: AROM;Seated Neck Flexion: AROM;Seated Neck Extension: AROM;Seated Neck Lateral Flexion - Right: AROM;Seated Neck Lateral Flexion - Left: AROM;Seated   Balance     End of Session OT - End of Session Activity Tolerance: Patient tolerated treatment well Patient left: in chair;with call bell/phone within reach Nurse Communication: Mobility status  GO     Cleora Fleet 02/19/2012, 10:06 AM

## 2012-02-19 NOTE — Progress Notes (Signed)
I agree with the following treatment note after reviewing documentation.   Johnston, Adamaris King Brynn   OTR/L Pager: [phone] Office: [phone] .   

## 2012-02-26 ENCOUNTER — Encounter (HOSPITAL_COMMUNITY): Payer: Self-pay | Admitting: Orthopedic Surgery

## 2012-12-06 ENCOUNTER — Other Ambulatory Visit: Payer: Self-pay | Admitting: Internal Medicine

## 2012-12-06 DIAGNOSIS — Z1231 Encounter for screening mammogram for malignant neoplasm of breast: Secondary | ICD-10-CM

## 2012-12-27 ENCOUNTER — Ambulatory Visit
Admission: RE | Admit: 2012-12-27 | Discharge: 2012-12-27 | Disposition: A | Discharge status: Home / Self Care | Payer: No Typology Code available for payment source | Source: Ambulatory Visit | Attending: Internal Medicine | Admitting: Internal Medicine

## 2012-12-27 DIAGNOSIS — Z1231 Encounter for screening mammogram for malignant neoplasm of breast: Secondary | ICD-10-CM

## 2013-01-26 ENCOUNTER — Encounter: Payer: Self-pay | Admitting: Dietician

## 2013-01-26 ENCOUNTER — Encounter: Payer: No Typology Code available for payment source | Attending: Internal Medicine | Admitting: Dietician

## 2013-01-26 DIAGNOSIS — E119 Type 2 diabetes mellitus without complications: Secondary | ICD-10-CM | POA: Insufficient documentation

## 2013-01-26 DIAGNOSIS — Z713 Dietary counseling and surveillance: Secondary | ICD-10-CM | POA: Insufficient documentation

## 2013-01-26 NOTE — Patient Instructions (Signed)
Goals:  Follow Diabetes Meal Plan as instructed  Eat 3 meals and 2 snacks, every 3-5 hrs  Limit carbohydrate intake to 3-45 grams carbohydrate/meal  Limit carbohydrate intake to 15 grams carbohydrate/snack  Add lean protein foods to meals/snacks  Monitor glucose levels as instructed by your doctor  Aim for 30 mins of physical activity daily  Bring food record and glucose log to your next nutrition visit

## 2013-01-26 NOTE — Progress Notes (Signed)
Patient was seen on 01/26/13 for the first of a series of three diabetes self-management courses at the [facility].   Current HbA1c: 6.7 on 11/08/12  The following learning objectives were met by the patient during this course:   Defines the role of glucose and insulin  Identifies type of diabetes and pathophysiology  Defines the diagnostic criteria for diabetes and prediabetes  States the risk factors for Type 2 Diabetes  States the symptoms of Type 2 Diabetes  Defines Type 2 Diabetes treatment goals  Defines Type 2 Diabetes treatment options  States the rationale for glucose monitoring  Identifies A1C, glucose targets, and testing times  Identifies proper sharps disposal  Defines the purpose of a diabetes food plan  Identifies carbohydrate food groups  Defines effects of carbohydrate foods on glucose levels  Identifies carbohydrate choices/grams/food labels  States benefits of physical activity and effect on glucose  Review of suggested activity guidelines  Handouts given during class include:  Type 2 Diabetes: Basics Book  My Food Plan Book  Food and Activity Log  Your patient has identified their diabetes self-care support plan as:  [facility] support group  Follow-Up Plan: Attend core 2 and core 3

## 2013-03-02 ENCOUNTER — Encounter: Payer: No Typology Code available for payment source | Attending: Internal Medicine

## 2013-03-02 ENCOUNTER — Ambulatory Visit: Payer: No Typology Code available for payment source

## 2013-03-02 DIAGNOSIS — Z713 Dietary counseling and surveillance: Secondary | ICD-10-CM | POA: Insufficient documentation

## 2013-03-02 DIAGNOSIS — E119 Type 2 diabetes mellitus without complications: Secondary | ICD-10-CM | POA: Insufficient documentation

## 2013-03-10 NOTE — Progress Notes (Signed)
  Patient was seen on 03/02/13 for the third of a series of three diabetes self-management courses at the [facility]. The following learning objectives were met by the patient during this course:    Describe how diabetes changes over time   Identify diabetes complications and ways to prevent them   Describe strategies that can promote heart health including lowering blood pressure and cholesterol   Describe strategies to lower dietary fat and sodium in the diet   Identify physical activities that benefit cardiovascular health   Evaluate success in meeting personal goal   Describe the belief that they can live successfully with diabetes day to day   Establish 2-3 goals that they will plan to diligently work on until they return for the free 65-month follow-up visit  The following handouts were given in class:  4 Month Follow Up Visit handout  Goal setting handout  Class evaluation form  Your patient has established the following 4 month goals for diabetes self-care:  Start a record book  Exercise 3 X week  Relax and take care of self  Start testing glucose  Count carbs   Follow-Up Plan: Patient will attend a 4 month follow-up visit for diabetes self-management education.

## 2013-07-04 ENCOUNTER — Ambulatory Visit: Payer: No Typology Code available for payment source | Admitting: *Deleted

## 2013-11-27 ENCOUNTER — Emergency Department (HOSPITAL_COMMUNITY): Payer: No Typology Code available for payment source

## 2013-11-27 ENCOUNTER — Observation Stay (HOSPITAL_COMMUNITY)
Admission: EM | Admit: 2013-11-27 | Discharge: 2013-11-28 | Disposition: A | Discharge status: Home / Self Care | Payer: No Typology Code available for payment source | Attending: Internal Medicine | Admitting: Internal Medicine

## 2013-11-27 ENCOUNTER — Encounter (HOSPITAL_COMMUNITY): Payer: Self-pay | Admitting: Emergency Medicine

## 2013-11-27 DIAGNOSIS — M25512 Pain in left shoulder: Secondary | ICD-10-CM

## 2013-11-27 DIAGNOSIS — R630 Anorexia: Secondary | ICD-10-CM | POA: Insufficient documentation

## 2013-11-27 DIAGNOSIS — R110 Nausea: Secondary | ICD-10-CM | POA: Insufficient documentation

## 2013-11-27 DIAGNOSIS — R5383 Other fatigue: Secondary | ICD-10-CM

## 2013-11-27 DIAGNOSIS — R350 Frequency of micturition: Secondary | ICD-10-CM | POA: Insufficient documentation

## 2013-11-27 DIAGNOSIS — R7309 Other abnormal glucose: Secondary | ICD-10-CM | POA: Insufficient documentation

## 2013-11-27 DIAGNOSIS — R7303 Prediabetes: Secondary | ICD-10-CM

## 2013-11-27 DIAGNOSIS — I10 Essential (primary) hypertension: Secondary | ICD-10-CM | POA: Insufficient documentation

## 2013-11-27 DIAGNOSIS — J209 Acute bronchitis, unspecified: Principal | ICD-10-CM | POA: Insufficient documentation

## 2013-11-27 DIAGNOSIS — L259 Unspecified contact dermatitis, unspecified cause: Secondary | ICD-10-CM | POA: Insufficient documentation

## 2013-11-27 DIAGNOSIS — D72829 Elevated white blood cell count, unspecified: Secondary | ICD-10-CM | POA: Diagnosis present

## 2013-11-27 DIAGNOSIS — R5381 Other malaise: Secondary | ICD-10-CM | POA: Insufficient documentation

## 2013-11-27 DIAGNOSIS — R51 Headache: Secondary | ICD-10-CM | POA: Insufficient documentation

## 2013-11-27 DIAGNOSIS — Z96619 Presence of unspecified artificial shoulder joint: Secondary | ICD-10-CM | POA: Insufficient documentation

## 2013-11-27 DIAGNOSIS — J45909 Unspecified asthma, uncomplicated: Secondary | ICD-10-CM | POA: Insufficient documentation

## 2013-11-27 DIAGNOSIS — J029 Acute pharyngitis, unspecified: Secondary | ICD-10-CM | POA: Insufficient documentation

## 2013-11-27 DIAGNOSIS — R55 Syncope and collapse: Secondary | ICD-10-CM

## 2013-11-27 DIAGNOSIS — M1990 Unspecified osteoarthritis, unspecified site: Secondary | ICD-10-CM | POA: Insufficient documentation

## 2013-11-27 DIAGNOSIS — M19019 Primary osteoarthritis, unspecified shoulder: Secondary | ICD-10-CM

## 2013-11-27 DIAGNOSIS — K219 Gastro-esophageal reflux disease without esophagitis: Secondary | ICD-10-CM | POA: Insufficient documentation

## 2013-11-27 DIAGNOSIS — R531 Weakness: Secondary | ICD-10-CM

## 2013-11-27 DIAGNOSIS — R509 Fever, unspecified: Secondary | ICD-10-CM

## 2013-11-27 LAB — CBC WITH DIFFERENTIAL/PLATELET
BASOS ABS: 0 10*3/uL (ref 0.0–0.1)
BASOS PCT: 0 % (ref 0–1)
Eosinophils Absolute: 0 10*3/uL (ref 0.0–0.7)
Eosinophils Relative: 0 % (ref 0–5)
HCT: 39.4 % (ref 36.0–46.0)
Hemoglobin: 14 g/dL (ref 12.0–15.0)
LYMPHS PCT: 6 % — AB (ref 12–46)
Lymphs Abs: 1.3 10*3/uL (ref 0.7–4.0)
MCH: 28.1 pg (ref 26.0–34.0)
MCHC: 35.5 g/dL (ref 30.0–36.0)
MCV: 79.1 fL (ref 78.0–100.0)
Monocytes Absolute: 0.8 10*3/uL (ref 0.1–1.0)
Monocytes Relative: 4 % (ref 3–12)
NEUTROS ABS: 18.2 10*3/uL — AB (ref 1.7–7.7)
Neutrophils Relative %: 90 % — ABNORMAL HIGH (ref 43–77)
PLATELETS: 250 10*3/uL (ref 150–400)
RBC: 4.98 MIL/uL (ref 3.87–5.11)
RDW: 15.5 % (ref 11.5–15.5)
WBC: 20.3 10*3/uL — AB (ref 4.0–10.5)

## 2013-11-27 LAB — COMPREHENSIVE METABOLIC PANEL
ALBUMIN: 4.3 g/dL (ref 3.5–5.2)
ALT: 28 U/L (ref 0–35)
AST: 22 U/L (ref 0–37)
Alkaline Phosphatase: 96 U/L (ref 39–117)
Anion gap: 16 — ABNORMAL HIGH (ref 5–15)
BILIRUBIN TOTAL: 0.5 mg/dL (ref 0.3–1.2)
BUN: 11 mg/dL (ref 6–23)
CALCIUM: 9.7 mg/dL (ref 8.4–10.5)
CHLORIDE: 98 meq/L (ref 96–112)
CO2: 24 meq/L (ref 19–32)
Creatinine, Ser: 0.89 mg/dL (ref 0.50–1.10)
GFR calc Af Amer: 78 mL/min — ABNORMAL LOW (ref >90)
GFR, EST NON AFRICAN AMERICAN: 67 mL/min — AB (ref >90)
Glucose, Bld: 108 mg/dL — ABNORMAL HIGH (ref 70–99)
Potassium: 3.8 mEq/L (ref 3.7–5.3)
SODIUM: 138 meq/L (ref 137–147)
Total Protein: 8.5 g/dL — ABNORMAL HIGH (ref 6.0–8.3)

## 2013-11-27 LAB — SEDIMENTATION RATE: Sed Rate: 30 mm/hr — ABNORMAL HIGH (ref 0–22)

## 2013-11-27 LAB — RAPID STREP SCREEN (MED CTR MEBANE ONLY): STREPTOCOCCUS, GROUP A SCREEN (DIRECT): NEGATIVE

## 2013-11-27 LAB — URINALYSIS, ROUTINE W REFLEX MICROSCOPIC
Bilirubin Urine: NEGATIVE
GLUCOSE, UA: NEGATIVE mg/dL
Hgb urine dipstick: NEGATIVE
Ketones, ur: NEGATIVE mg/dL
LEUKOCYTES UA: NEGATIVE
Nitrite: NEGATIVE
PH: 6 (ref 5.0–8.0)
Protein, ur: NEGATIVE mg/dL
SPECIFIC GRAVITY, URINE: 1.011 (ref 1.005–1.030)
Urobilinogen, UA: 0.2 mg/dL (ref 0.0–1.0)

## 2013-11-27 LAB — CBG MONITORING, ED: Glucose-Capillary: 106 mg/dL — ABNORMAL HIGH (ref 70–99)

## 2013-11-27 MED ORDER — ONDANSETRON HCL 4 MG PO TABS: 4.0000 mg | ORAL_TABLET | Freq: Four times a day (QID) | ORAL | Status: DC | PRN

## 2013-11-27 MED ORDER — SODIUM CHLORIDE 0.9 % IV BOLUS (SEPSIS)
500.0000 mL | Freq: Once | INTRAVENOUS | Status: AC
  Administered 2013-11-27: 500 mL via INTRAVENOUS

## 2013-11-27 MED ORDER — ACETAMINOPHEN 325 MG PO TABS
650.0000 mg | ORAL_TABLET | Freq: Once | ORAL | Status: AC
  Administered 2013-11-27: 650 mg via ORAL
  Filled 2013-11-27: qty 2

## 2013-11-27 MED ORDER — MORPHINE SULFATE 4 MG/ML IJ SOLN
6.0000 mg | Freq: Once | INTRAMUSCULAR | Status: AC
  Administered 2013-11-27: 6 mg via INTRAVENOUS
  Filled 2013-11-27: qty 2

## 2013-11-27 MED ORDER — FLUTICASONE PROPIONATE HFA 44 MCG/ACT IN AERO
2.0000 | INHALATION_SPRAY | Freq: Two times a day (BID) | RESPIRATORY_TRACT | Status: DC
  Administered 2013-11-27 – 2013-11-28 (×2): 2 via RESPIRATORY_TRACT
  Filled 2013-11-27: qty 10.6

## 2013-11-27 MED ORDER — ONDANSETRON HCL 4 MG/2ML IJ SOLN
4.0000 mg | Freq: Once | INTRAMUSCULAR | Status: AC
  Administered 2013-11-27: 4 mg via INTRAVENOUS
  Filled 2013-11-27: qty 2

## 2013-11-27 MED ORDER — ONDANSETRON HCL 4 MG/2ML IJ SOLN: 4.0000 mg | Freq: Four times a day (QID) | INTRAMUSCULAR | Status: DC | PRN

## 2013-11-27 MED ORDER — HYDROCODONE-ACETAMINOPHEN 7.5-325 MG/15ML PO SOLN: 10.0000 mL | Freq: Four times a day (QID) | ORAL | Status: AC | PRN

## 2013-11-27 MED ORDER — ENOXAPARIN SODIUM 40 MG/0.4ML ~~LOC~~ SOLN
40.0000 mg | SUBCUTANEOUS | Status: DC
  Administered 2013-11-27: 40 mg via SUBCUTANEOUS
  Filled 2013-11-27 (×2): qty 0.4

## 2013-11-27 MED ORDER — ACETAMINOPHEN 650 MG RE SUPP: 650.0000 mg | Freq: Four times a day (QID) | RECTAL | Status: DC | PRN

## 2013-11-27 MED ORDER — ACETAMINOPHEN 325 MG PO TABS
650.0000 mg | ORAL_TABLET | Freq: Four times a day (QID) | ORAL | Status: DC | PRN
  Administered 2013-11-27: 650 mg via ORAL
  Filled 2013-11-27: qty 2

## 2013-11-27 MED ORDER — ALBUTEROL SULFATE (2.5 MG/3ML) 0.083% IN NEBU: 3.0000 mL | INHALATION_SOLUTION | Freq: Four times a day (QID) | RESPIRATORY_TRACT | Status: DC | PRN

## 2013-11-27 MED ORDER — SODIUM CHLORIDE 0.9 % IV SOLN
INTRAVENOUS | Status: DC
  Administered 2013-11-27: 19:00:00 via INTRAVENOUS

## 2013-11-27 MED ORDER — AMLODIPINE BESYLATE 5 MG PO TABS
5.0000 mg | ORAL_TABLET | Freq: Every day | ORAL | Status: DC
  Administered 2013-11-27 – 2013-11-28 (×2): 5 mg via ORAL
  Filled 2013-11-27 (×2): qty 1

## 2013-11-27 MED ORDER — MORPHINE SULFATE 4 MG/ML IJ SOLN
6.0000 mg | Freq: Once | INTRAMUSCULAR | Status: AC
Start: 2013-11-27 — End: 2013-11-27
  Administered 2013-11-27: 6 mg via INTRAVENOUS
  Filled 2013-11-27: qty 2

## 2013-11-27 MED ORDER — FAMOTIDINE 20 MG PO TABS
20.0000 mg | ORAL_TABLET | Freq: Every day | ORAL | Status: DC
  Administered 2013-11-27: 20 mg via ORAL
  Filled 2013-11-27 (×2): qty 1

## 2013-11-27 NOTE — ED Notes (Signed)
Received pt from home via EMS with c/o headache, Nausea and dizziness onset last night. Pt talking in complete sentences without difficulty. Tongue midline, no facial droop.

## 2013-11-27 NOTE — H&P (Signed)
Date: 11/27/2013               Patient Name:  Katrina Gardner MRN: [id number]  DOB: 01-04-1951 Age / Sex: 63 y.o., female   PCP: Jefm Petty, MD         Medical Service: Internal Medicine Teaching Service         Attending Physician: Dr. Karren Cobble, MD    First Contact: Dr. Hulen Luster Pager: [phone]  Second Contact: Dr. Eula Fried Pager: [phone]       After Hours (After 5p/  First Contact Pager: [phone]  weekends / holidays): Second Contact Pager: [phone]   Chief Complaint: generalized weakness  History of Present Illness: Katrina Gardner is a 63 y.o. F with PMHx of HTN, asthma, DJD and GERD who presents with chief complaint of generalized weakness. Pt states she starting feeling badly last night, feeling feverish off and on, but never took her temperature. She developed a non-productive cough last night and also had increased frequency of urination. She also complains of general malaise, nausea, loss of appetite, and headache. Pt denies any recent hospitalizations. She denies any recent sick contacts. She has been taking all of her medications as prescribed.   Meds: Current Facility-Administered Medications  Medication Dose Route Frequency Provider Last Rate Last Dose  . 0.9 %  sodium chloride infusion   Intravenous STAT Mirna Mires, MD 100 mL/hr at 11/27/13 1848    . acetaminophen (TYLENOL) tablet 650 mg  650 mg Oral Q6H PRN Corky Sox, MD   650 mg at 11/27/13 2201   Or  . acetaminophen (TYLENOL) suppository 650 mg  650 mg Rectal Q6H PRN Corky Sox, MD      . albuterol (PROVENTIL) (2.5 MG/3ML) 0.083% nebulizer solution 3 mL  3 mL Inhalation Q6H PRN Corky Sox, MD      . amLODipine (NORVASC) tablet 5 mg  5 mg Oral Daily Corky Sox, MD   5 mg at 11/27/13 2159  . enoxaparin (LOVENOX) injection 40 mg  40 mg Subcutaneous Q24H Corky Sox, MD   40 mg at 11/27/13 2158  . famotidine (PEPCID) tablet 20 mg  20 mg Oral QHS Corky Sox, MD   20 mg at 11/27/13 2159  . fluticasone  (FLOVENT HFA) 44 MCG/ACT inhaler 2 puff  2 puff Inhalation BID Corky Sox, MD   2 puff at 11/27/13 2146  . ondansetron (ZOFRAN) tablet 4 mg  4 mg Oral Q6H PRN Corky Sox, MD       Or  . ondansetron [facility]) injection 4 mg  4 mg Intravenous Q6H PRN Corky Sox, MD        Allergies: Allergies as of 11/27/2013 - Review Complete 11/27/2013  Allergen Reaction Noted  . Penicillins Hives and Itching 02/12/2012  . Latex Rash 02/12/2012   Past Medical History  Diagnosis Date  . Hypertension   . Asthma   . GERD (gastroesophageal reflux disease)     tums  . Arthritis   . Eczema   . Shortness of breath    Past Surgical History  Procedure Laterality Date  . Abdominal hysterectomy    . Arthroscopic knee      left  . Tonsillectomy    . Rotator cuff repair  02/18/2012    RT SHOULDER  . Reverse shoulder arthroplasty  02/18/2012    Procedure: REVERSE SHOULDER ARTHROPLASTY;  Surgeon: Marin Shutter, MD;  Location: [facility];  Service: Orthopedics;  Laterality:  Right;  Right Reverse Shoulder Arthroplasty    No family history on file. History   Social History  . Marital Status: Divorced    Spouse Name: N/A    Number of Children: N/A  . Years of Education: N/A   Occupational History  . Not on file.   Social History Main Topics  . Smoking status: Never Smoker   . Smokeless tobacco: Never Used  . Alcohol Use: No  . Drug Use: No  . Sexual Activity: Not on file   Other Topics Concern  . Not on file   Social History Narrative  . No narrative on file    Review of Systems: General: Admits to fever, chills, diaphoresis, decreased appetite and fatigue. Denies weight loss. Respiratory: Admits to one episode of SOB in ED during exam and cough since last night. But denies DOE, cough, chest tightness, and wheezing.   Cardiovascular: Denies chest pain and palpitations.  Gastrointestinal: Admits to nausea and sore throat, but denies vomiting, abdominal pain, diarrhea, constipation, blood in  stool and abdominal distention.  Genitourinary: Admits to increased frequency, but Denies dysuria, urgency, hematuria, and flank pain. Endocrine: Denies hot or cold intolerance, polyuria, and polydipsia. Musculoskeletal: Admits to bilaterally shoulder pain with movement-chronic issue. Denies myalgias, back pain, joint swelling, arthralgias and gait problem.  Skin: Admits to a recent eczematous plaque infection on medial left lower extremity, but denies skin ulcers, wounds, rash.  Neurological: Admits to dizziness, lightheadedness, headache, and weakness, but denies syncope, seizures, numbness. Psychiatric/Behavioral: Denies mood changes, confusion, nervousness, sleep disturbance and agitation.  Physical Exam: Filed Vitals:   11/27/13 1600 11/27/13 1729 11/27/13 1838 11/27/13 2146  BP: 129/70 130/74 139/86 127/74  Pulse: 75 74 71 70  Temp:  97.8 F (36.6 C) 98 F (36.7 C) 97.5 F (36.4 C)  TempSrc:  Oral Oral Oral  Resp: 17 16 18 18   Height:   5' 3"  (1.6 m)   Weight:   169 lb 12.1 oz (77 kg)   SpO2: 94% 95% 96% 95%   General: Vital signs reviewed.  Patient is a well-developed and well-nourished, in no acute distress, laying in bed, and cooperative with exam.  Head: Normocephalic and atraumatic. Eyes: PERRL, EOMI, conjunctivae normal, No scleral icterus.  Neck: No masses or lymphadenopathy. Supple, trachea midline, normal ROM, No JVD, thyromegaly, or carotid bruit present.  Cardiovascular: RRR,  no murmurs, gallops, or rubs. Pulmonary/Chest: Clear to auscultation bilaterally, no wheezes, rales, or rhonchi. Abdominal: Soft, non-tender, non-distended, BS +, no masses, organomegaly, or guarding present.  Musculoskeletal: No joint deformities, erythema, or stiffness, ROM full and nontender. Extremities: Left upper extremity malformed since birth. Stays in flexion, wrist also in flexion. Possible dupetrynes contracture versus Erb's palsy. Negative lower edema,  pulses symmetric and intact  bilaterally. No cyanosis or clubbing. Neurological: A&O x3, Strength is normal and symmetric bilaterally, cranial nerve II-XII are grossly intact, no focal motor deficit, sensory intact to light touch bilaterally.  Skin: No supraclavicular, axillary or groin lymphadenopathy. Warm, dry and intact. No rashes or erythema. No obvious wounds, ulcers or signs of infection. Small lesion on medial lower left extremity--hyperpigmented, but not erythematous, pustular, warm. No sign of infection. Psychiatric: Normal mood and affect. speech and behavior is normal. Cognition and memory are normal.   Lab results: Basic Metabolic Panel:  Recent Labs  11/27/13 1111  NA 138  K 3.8  CL 98  CO2 24  GLUCOSE 108*  BUN 11  CREATININE 0.89  CALCIUM 9.7   Liver  Function Tests:  Recent Labs  11/27/13 1111  AST 22  ALT 28  ALKPHOS 96  BILITOT 0.5  PROT 8.5*  ALBUMIN 4.3   CBC:  Recent Labs  11/27/13 1111  WBC 20.3*  NEUTROABS 18.2*  HGB 14.0  HCT 39.4  MCV 79.1  PLT 250   CBG:  Recent Labs  11/27/13 1354  GLUCAP 106*    Recent Labs  11/27/13 1407  COLORURINE YELLOW  LABSPEC 1.011  PHURINE 6.0  GLUCOSEU NEGATIVE  HGBUR NEGATIVE  BILIRUBINUR NEGATIVE  KETONESUR NEGATIVE  PROTEINUR NEGATIVE  UROBILINOGEN 0.2  NITRITE NEGATIVE  LEUKOCYTESUR NEGATIVE    Imaging results:  Dg Chest 2 View  11/27/2013   CLINICAL DATA:  Cough and weakness. High blood pressure. Nonsmoker.  EXAM: CHEST  2 VIEW  COMPARISON:  02/16/2012.  FINDINGS: No infiltrate, congestive heart failure or pneumothorax.  Mildly tortuous aorta.  Heart top-normal size.  No plain film evidence of pulmonary malignancy. If there is a persistent unexplained cough followup imaging may be considered.  Post right shoulder replacement.  IMPRESSION: No acute abnormality.  Please see above.   Electronically Signed   By: Chauncey Cruel M.D.   On: 11/27/2013 13:08   Ct Head Wo Contrast  11/27/2013   CLINICAL DATA:  63 year old  female with headache, nausea and dizziness.  EXAM: CT HEAD WITHOUT CONTRAST  TECHNIQUE: Contiguous axial images were obtained from the base of the skull through the vertex without intravenous contrast.  COMPARISON:  None.  FINDINGS: Chronic ischemic changes versus remote infarct in the high right frontal region again noted.  No acute intracranial abnormalities are identified, including mass lesion or mass effect, hydrocephalus, extra-axial fluid collection, midline shift, hemorrhage, or acute infarction.  The visualized bony calvarium is unremarkable.  IMPRESSION: No evidence of acute intracranial abnormality.   Electronically Signed   By: Hassan Rowan M.D.   On: 11/27/2013 13:49   Dg Shoulder Left  11/27/2013   CLINICAL DATA:  Pain.  EXAM: LEFT SHOULDER - 2+ VIEW  COMPARISON:  Chest x-ray 11/27/2013.  FINDINGS: Mild acromioclavicular and glenohumeral degenerative change. No evidence of fracture, separation, or dislocation.  IMPRESSION: DJD, otherwise negative exam.   Electronically Signed   By: Marcello Moores  Register   On: 11/27/2013 16:43    Other results: EKG: normal EKG, normal sinus rhythm, unchanged from previous tracings, normal sinus rhythm.  Assessment & Plan by Problem:  Acute Bronchitis: Pt complaining of recent non-productive cough, fever, sore throat and fatigue for 2 days. One episode of shortness of breath in ED after physical exam. Rectal temperature shows highest febrile reading at 100.7. Currently 97.5. Normotensive, not tachycardic or tachypneic. Leukocytosis with WBC of 20.3. Other causes of infection being ruled out. Rapid strep negative. Unlikely pneumonia or pulmonary malignancy. No recent hospitalization or sick contacts. No hemoptysis, sputum production or smoking history. CXR showed no infiltrate, congestive heart failure or pneumothorax. No evidence of pulmonary malignancy. No concern for meningitis--no neck pain or stiffness; however does admit to fever and headache. Ct head without  contrast is negative for any acute abnormalities. UTI also ruled out. Pt denies any urinary symptoms-denies dysuria, frequency or urgency. UA negative for nitrites or leukocytes. Patient has chronic should pain s/p right shoulder arthroplasty in November 2013. Unlikely septic arthritis. No erythema, cellulitis. Normal movement. Recent pain in left shoulder as well. Left shoulder Xray showed mild acromioclavicular and glenohumeral degenerative change. No evidence of fracture, separation, or dislocation. Pt has no evidence of infected wounds or  ulcers on her skin. ESR elevated at 30. -Pt given 2 boluses of 500cc NS, morphine 6 mg IV, acetaminophen 650 mg and zofran for nausea in the ED -Strep culture pending -Blood cultures x 2 pending -Zofran prn nausea -CBC and BMP in am -Regular diet  Asthma: Denies any current shortness of breath or wheezing. Pulse ox is 95 % on room air. On albuterol 2 puffs Q6H prn wheezing and beclomethasone 80 mcg 2 puffs BID prn shortness of breath at home. -Albuterol 2 puffs Q6H prn wheezing  -Flovent 2 puffs BID  HTN: BP is 139/86 currently. On amlodipine 5 mg daily at home.  -amlodipine 5 mg daily  GERD: Denies any reflux. Nauseous currently. On ranitidine 150 mg prn heartburn. -Famotidine 20 mg daily -Zofran prn nausea  Eczema: Pt uses a topical cream at home BID for eczema. Eczematous lesion on medial lower left extremity. Resolving. -Hold for now  DVT/PE ppx: Lovenox 40 mg SQ daily.   Dispo: Disposition is deferred at this time, awaiting improvement of current medical problems. Anticipated discharge in approximately 1-2 day(s).   The patient does have a current PCP Jefm Petty, MD) and does not need an [facility] hospital follow-up appointment after discharge.  The patient does not have transportation limitations that hinder transportation to clinic appointments.  Signed: Osa Craver, DO PGY-1 Internal Medicine Resident Pager # [phone] 11/27/2013  10:27 PM

## 2013-11-27 NOTE — ED Notes (Signed)
Pt c/o HA that started last night then woke this morning feeling nauseous and dizzy, like the room is spinning. sts she is still feeling dizzy now and continues to have the HA and nausea. Denies vomiting. Denies abd pain but sts she is having "hunger pains". Denies difficulty speaking/walking/weakness. No slurred speech, no facial droop. Nad, skin warm and dry, resp e/u.

## 2013-11-27 NOTE — Progress Notes (Signed)
Patient Arrived from Ed via Doctor, general practicestretcher, Dr.Truong Notified. Patient oriented to the room and make comfortable.

## 2013-11-27 NOTE — ED Notes (Signed)
Pharmacy tech at bedside 

## 2013-11-27 NOTE — ED Notes (Signed)
Phlebotomy at bedside.

## 2013-11-27 NOTE — ED Notes (Signed)
Regular Diet ordered 

## 2013-11-27 NOTE — ED Provider Notes (Signed)
Medical screening examination/treatment/procedure(s) were conducted as a shared visit with non-physician practitioner(s) and myself.  I personally evaluated the patient during the encounter.   EKG Interpretation   Date/Time:  Monday November 27 2013 12:24:32 EDT Ventricular Rate:  99 PR Interval:  163 QRS Duration: 73 QT Interval:  345 QTC Calculation: 443 R Axis:   60 Text Interpretation:  Sinus rhythm Consider left atrial enlargement  Borderline T wave abnormalities No significant change since last tracing  Confirmed by Ahmia Colford  MD, Caryn BeeKEVIN ([id number]) on 11/27/2013 12:30:54 PM      Pt c/o feeling generally weak for past 1-2 days, subj fever at home, t 101 in ed.  C/o intermittent frontal headaches, sore throat.  Pt also notes left shoulder pain x 1 month, slowly worse - no swelling or redness.  Hx djd and right shoulder surgery previously.   Chest cta. Rrr. abd soft nt.  Absolutely no neck stiffness or rigidity, no meningeal signs. Painful active rom right shoulder, shoulder tenderness. No increased warmth, swelling, or cellulitis.   After initial workup, pt states continues to feel gen weak, faint when stands. Headache improved/resolved. On repeat exam, no neck stiffness.  Chest cta. Rrr.    As pt continues to feel weak/faint, markedly elevated wbc (20 w left shift), will add blood cxs, sed rate to workup, give ivf, and call unassigned med service for obs/admission.      Suzi RootsKevin E Satya Bohall, MD 11/27/13 [phone]

## 2013-11-27 NOTE — ED Provider Notes (Signed)
CSN: [id number]     Arrival date & time 11/27/13  [id number] History   First MD Initiated Contact with Patient 11/27/13 1103     Chief Complaint  Patient presents with  . Nausea  . Headache  . Dizziness     (Consider location/radiation/quality/duration/timing/severity/associated sxs/prior Treatment) HPI  63 year old female with history of GERD, asthma, hypertension who is brought here via EMS from home for evaluation of headache nausea and dizziness.  Gradual onset of headache since last night, described as heavy sensation to frontal region, non radiating, 7/10.  Through the night pt experienced nausea, headache worsen, walking caused dizziness, blurry vision, and diaphoretic.  Some mild non productive cough this AM. Also report having sore throat, worsening with swalling.   In the past month, experienced intermittent, non radiating L shoulder pain, no specific injury.  Did endorse some palpitation last night, resolved.  No hx of recurrent headache.  Normal BM.  Denies urinary complaint, sob, hemoptysis or rash.    Past Medical History  Diagnosis Date  . Hypertension   . Asthma   . GERD (gastroesophageal reflux disease)     tums  . Arthritis   . Eczema   . Shortness of breath    Past Surgical History  Procedure Laterality Date  . Abdominal hysterectomy    . Arthroscopic knee      left  . Tonsillectomy    . Rotator cuff repair  02/18/2012    RT SHOULDER  . Reverse shoulder arthroplasty  02/18/2012    Procedure: REVERSE SHOULDER ARTHROPLASTY;  Surgeon: Senaida LangeKevin M Supple, MD;  Location: [facility] OR;  Service: Orthopedics;  Laterality: Right;  Right Reverse Shoulder Arthroplasty    No family history on file. History  Substance Use Topics  . Smoking status: Never Smoker   . Smokeless tobacco: Never Used  . Alcohol Use: No   OB History   Grav Para Term Preterm Abortions TAB SAB Ect Mult Living                 Review of Systems  All other systems reviewed and are  negative.     Allergies  Penicillins and Latex  Home Medications   Prior to Admission medications   Medication Sig Start Date End Date Taking? Authorizing Provider  ALBUTEROL IN Inhale into the lungs.   Yes Historical Provider, MD  amLODipine (NORVASC) 5 MG tablet Take 5 mg by mouth daily.   Yes Historical Provider, MD  ranitidine (ZANTAC) 150 MG tablet Take 150 mg by mouth as needed for heartburn.   Yes Historical Provider, MD  UNKNOWN TO PATIENT Eczema cream   Yes Historical Provider, MD   BP 142/78  Pulse 102  Temp(Src) 99.2 F (37.3 C) (Oral)  Resp 18  Ht 5\' 3"  (1.6 m)  Wt 170 lb (77.111 kg)  BMI 30.12 kg/m2  SpO2 97% Physical Exam  Nursing note and vitals reviewed. Constitutional: She is oriented to person, place, and time. She appears well-developed and well-nourished. No distress.  HENT:  Head: Atraumatic.  Nose: Nose normal.  Mouth/Throat: Oropharynx is clear and moist.  Tenderness to frontal and maxillary region on percussion  R ear: impacted cerumen, L ear: normal Nose: normal nares Throat: uvula midline, no tonsillar enlargement or exudates  Eyes: Conjunctivae and EOM are normal. Pupils are equal, round, and reactive to light.  No nystagmus  Neck: Normal range of motion. Neck supple.  No nuchal ridigity  Cardiovascular: Normal rate and regular rhythm.  Exam reveals no gallop  and no friction rub.   No murmur heard. Pulmonary/Chest: Effort normal and breath sounds normal. She has no wheezes.  Abdominal: Soft. Bowel sounds are normal. There is no tenderness. There is no rebound and no guarding.  Musculoskeletal: She exhibits tenderness (L shoulder with generalized tenderness with palpation, no overlying skin changes, no rash.  decreased ROM 2/2 pain.  normal skin tone and sensation.  ). She exhibits no edema.  Lymphadenopathy:    She has no cervical adenopathy.  Neurological: She is alert and oriented to person, place, and time. No cranial nerve deficit or  sensory deficit. GCS eye subscore is 4. GCS verbal subscore is 5. GCS motor subscore is 6.  Pt with L arm contracture (chronic), therefore difficult to assess strength but otherwise 5/5 strength to all 4 extremities.    Speech clear, pupils equal round reactive to light, extraocular movements intact   Normal peripheral visual fields Cranial nerves III through XII normal including no facial droop Follows commands, moves all extremities x4,  Sensation normal to light touch  Coordination difficult to assess due to L arm contracture, however no limb ataxia, finger-nose-finger normal Rapid alternating movements not tested No pronator drift Gait normal   Skin: No rash noted.  Psychiatric: She has a normal mood and affect.    ED Course  Procedures (including critical care time)  11:33 AM Pt has progressive worsening headache, dizziness, and nausea. Also report sore throat.  She is currently in NAD.  No fever, or meingismal sign to suggest meningitis, no obvious focal neuro deficit to suggest stroke.  No sudden onset thunderclap headache to suggest SAH.  Given new headache with presenting sxs, work up initiated including head CT.  Suspect viral syndrome.    2:02 PM Patient has an elevated WBC of 20.3 with a left shift. She does have an elevated temperature of 100.7 rectally, Tylenol given. Her strep test is negative, chest x-ray shows no acute focal infiltrate concerning for pneumonia, head CT scan is unremarkable. Currently awaits urinalysis. We'll continue with symptom treatment. Discussed with Dr. Denton Lank  3:23 PM Dr. Denton Lank has evaluated pt and felt meningitis not likely.  Sxs likely URI with fever, sore throat, body aches with neg strep test.  Pt does report L shoulder discomfort, which has been ongoing for more than a month but worsening within the past week.  On exam pt has tenderness to L deltoid and bicep brachii.  Also has decreased L shoulder ROM with abduction/aduction/rotation.  No  overlying skin changes or warmth.  Doubt septic joint.  Xray ordered.    3:33 PM I have called Pt's PCP and spoke with Dr. Florentina Jenny nurse.  Pt will have close follow up with her PCP for recheck of labs and reevaluation.  Pt made aware to call the office today once discharge to schedule follow up.    4:05 PM Sign out given to Dr. Jodi Mourning who will d/c pt pending L shoulder xray result.  Pt made aware to f/u closely with PCP.    Labs Review Labs Reviewed  CBC WITH DIFFERENTIAL - Abnormal; Notable for the following:    WBC 20.3 (*)    Neutrophils Relative % 90 (*)    Neutro Abs 18.2 (*)    Lymphocytes Relative 6 (*)    All other components within normal limits  COMPREHENSIVE METABOLIC PANEL - Abnormal; Notable for the following:    Glucose, Bld 108 (*)    Total Protein 8.5 (*)    GFR calc non  Af Amer 67 (*)    GFR calc Af Amer 78 (*)    Anion gap 16 (*)    All other components within normal limits  CBG MONITORING, ED - Abnormal; Notable for the following:    Glucose-Capillary 106 (*)    All other components within normal limits  RAPID STREP SCREEN  CULTURE, GROUP A STREP  URINALYSIS, ROUTINE W REFLEX MICROSCOPIC    Imaging Review Dg Chest 2 View  11/27/2013   CLINICAL DATA:  Cough and weakness. High blood pressure. Nonsmoker.  EXAM: CHEST  2 VIEW  COMPARISON:  02/16/2012.  FINDINGS: No infiltrate, congestive heart failure or pneumothorax.  Mildly tortuous aorta.  Heart top-normal size.  No plain film evidence of pulmonary malignancy. If there is a persistent unexplained cough followup imaging may be considered.  Post right shoulder replacement.  IMPRESSION: No acute abnormality.  Please see above.   Electronically Signed   By: Bridgett Larsson M.D.   On: 11/27/2013 13:08   Ct Head Wo Contrast  11/27/2013   CLINICAL DATA:  63 year old female with headache, nausea and dizziness.  EXAM: CT HEAD WITHOUT CONTRAST  TECHNIQUE: Contiguous axial images were obtained from the base of the skull  through the vertex without intravenous contrast.  COMPARISON:  None.  FINDINGS: Chronic ischemic changes versus remote infarct in the high right frontal region again noted.  No acute intracranial abnormalities are identified, including mass lesion or mass effect, hydrocephalus, extra-axial fluid collection, midline shift, hemorrhage, or acute infarction.  The visualized bony calvarium is unremarkable.  IMPRESSION: No evidence of acute intracranial abnormality.   Electronically Signed   By: Laveda Abbe M.D.   On: 11/27/2013 13:49     EKG Interpretation   Date/Time:  Monday November 27 2013 12:24:32 EDT Ventricular Rate:  99 PR Interval:  163 QRS Duration: 73 QT Interval:  345 QTC Calculation: 443 R Axis:   60 Text Interpretation:  Sinus rhythm Consider left atrial enlargement  Borderline T wave abnormalities No significant change since last tracing  Confirmed by Denton Lank  MD, Caryn Bee ([id number]) on 11/27/2013 12:30:54 PM      MDM   Final diagnoses:  Viral syndrome    BP 117/75  Pulse 85  Temp(Src) 99.6 F (37.6 C) (Rectal)  Resp 16  Ht 5\' 3"  (1.6 m)  Wt 170 lb (77.111 kg)  BMI 30.12 kg/m2  SpO2 94%  I have reviewed nursing notes and vital signs. I personally reviewed the imaging tests through PACS system  I reviewed available ER/hospitalization records thought the EMR     Fayrene Helper, [place] 11/27/13 1606

## 2013-11-28 ENCOUNTER — Observation Stay (HOSPITAL_COMMUNITY): Payer: No Typology Code available for payment source

## 2013-11-28 DIAGNOSIS — R5383 Other fatigue: Secondary | ICD-10-CM

## 2013-11-28 DIAGNOSIS — R7303 Prediabetes: Secondary | ICD-10-CM | POA: Diagnosis present

## 2013-11-28 DIAGNOSIS — R5381 Other malaise: Secondary | ICD-10-CM

## 2013-11-28 DIAGNOSIS — D72829 Elevated white blood cell count, unspecified: Secondary | ICD-10-CM | POA: Diagnosis present

## 2013-11-28 LAB — CBC
HCT: 34.9 % — ABNORMAL LOW (ref 36.0–46.0)
HEMOGLOBIN: 11.9 g/dL — AB (ref 12.0–15.0)
MCH: 27.7 pg (ref 26.0–34.0)
MCHC: 34.1 g/dL (ref 30.0–36.0)
MCV: 81.2 fL (ref 78.0–100.0)
Platelets: 227 10*3/uL (ref 150–400)
RBC: 4.3 MIL/uL (ref 3.87–5.11)
RDW: 15.9 % — ABNORMAL HIGH (ref 11.5–15.5)
WBC: 14.5 10*3/uL — ABNORMAL HIGH (ref 4.0–10.5)

## 2013-11-28 LAB — BASIC METABOLIC PANEL
Anion gap: 13 (ref 5–15)
BUN: 10 mg/dL (ref 6–23)
CO2: 25 mEq/L (ref 19–32)
Calcium: 8.7 mg/dL (ref 8.4–10.5)
Chloride: 103 mEq/L (ref 96–112)
Creatinine, Ser: 0.83 mg/dL (ref 0.50–1.10)
GFR calc Af Amer: 85 mL/min — ABNORMAL LOW (ref >90)
GFR calc non Af Amer: 73 mL/min — ABNORMAL LOW (ref >90)
GLUCOSE: 88 mg/dL (ref 70–99)
Potassium: 3.9 mEq/L (ref 3.7–5.3)
Sodium: 141 mEq/L (ref 137–147)

## 2013-11-28 LAB — TSH: TSH: 1.31 u[IU]/mL (ref 0.350–4.500)

## 2013-11-28 LAB — C-REACTIVE PROTEIN: CRP: 7.2 mg/dL — ABNORMAL HIGH (ref <0.60)

## 2013-11-28 LAB — HEMOGLOBIN A1C
Hgb A1c MFr Bld: 6.2 % — ABNORMAL HIGH (ref <5.7)
Mean Plasma Glucose: 131 mg/dL — ABNORMAL HIGH (ref <117)

## 2013-11-28 MED ORDER — MENTHOL 3 MG MT LOZG
1.0000 | LOZENGE | Freq: Three times a day (TID) | OROMUCOSAL | Status: DC
  Administered 2013-11-28: 3 mg via ORAL
  Filled 2013-11-28: qty 9

## 2013-11-28 MED ORDER — PHENOL 1.4 % MT LIQD
1.0000 | OROMUCOSAL | Status: DC | PRN
  Filled 2013-11-28: qty 177

## 2013-11-28 MED ORDER — SODIUM CHLORIDE 0.9 % IV SOLN
INTRAVENOUS | Status: AC
  Administered 2013-11-28: 01:00:00 via INTRAVENOUS

## 2013-11-28 MED ORDER — CHLORHEXIDINE GLUCONATE 0.12 % MT SOLN: 15.0000 mL | Freq: Two times a day (BID) | OROMUCOSAL | Status: AC

## 2013-11-28 NOTE — Progress Notes (Signed)
Internal Medicine Attending  Date: 11/28/2013  Patient name: Katrina Gardner Medical record number: [id number] Date of birth: 06/13/1950 Age: 63 y.o. Gender: female  I saw and evaluated the patient. I developed the assessment and plan with the housestaff and reviewed the resident's note by Dr. Danella Pentonruong.  I agree with the resident's findings and plans as documented in her progress note.  Please see my addendum to the history and physical for today's evaluation, assessment, and plan. She is stable for discharge home with a discharge diagnosis of a viral pharyngitis.

## 2013-11-28 NOTE — H&P (Signed)
Internal Medicine Attending Admission Note Date: 11/28/2013  Patient name: Katrina Gardner Medical record number: [id number] Date of birth: [birth year] Age: 63 y.o. Gender: female  I saw and evaluated the patient. I reviewed the resident's note and I agree with the resident's findings and plan as documented in the resident's note.  Chief Complaint(s): Generalized weakness  History - key components related to admission:  Katrina Gardner is a 63 year old woman with a history of hypertension, asthma, osteoarthritis, and reflux who presents with a one-day history of generalized weakness and malaise. This was associated with a subjective fever, nonproductive cough, nausea, headache, decreased appetite, and sore throat. She denies any recent sick contacts.  On rounds this morning, after IV hydration, she felt much improved. Although she still had a sore throat it was also improved. She feels she is ready to return home without any difficulties.  Physical Exam - key components related to admission:  Filed Vitals:   11/27/13 1838 11/27/13 2146 11/28/13 0413 11/28/13 0810  BP: 139/86 127/74 146/78   Pulse: 71 70 75   Temp: 98 F (36.7 C) 97.5 F (36.4 C) 97.9 F (36.6 C)   TempSrc: Oral Oral Oral   Resp: 18 18 17    Height: 5' 3"  (1.6 m)     Weight: 169 lb 12.1 oz (77 kg)     SpO2: 96% 95% 96% 97%   General: Well-developed, well-nourished, woman sitting comfortably in a chair eating breakfast in no acute distress. HEENT: Pharynx without erythema or tonsillar exudate Neck: Without adenopathy or nuchal rigidity  Lab results:  Basic Metabolic Panel:  Recent Labs  11/27/13 1111 11/28/13 0530  NA 138 141  K 3.8 3.9  CL 98 103  CO2 24 25  GLUCOSE 108* 88  BUN 11 10  CREATININE 0.89 0.83  CALCIUM 9.7 8.7   Liver Function Tests:  Recent Labs  11/27/13 1111  AST 22  ALT 28  ALKPHOS 96  BILITOT 0.5  PROT 8.5*  ALBUMIN 4.3   CBC:  Recent Labs  11/27/13 1111 11/28/13 0530   WBC 20.3* 14.5*  NEUTROABS 18.2*  --   HGB 14.0 11.9*  HCT 39.4 34.9*  MCV 79.1 81.2  PLT 250 227   CBG:  Recent Labs  11/27/13 1354  GLUCAP 106*   Hemoglobin A1C:  Recent Labs  11/27/13 1953  HGBA1C 6.2*   Thyroid Function Tests:  Recent Labs  11/28/13 0847  TSH 1.310   Urinalysis:  Negative  Misc. Labs:  Rapid group a strep screen negative  ESR 30  C. reactive protein 7.2  Imaging results:  Dg Chest 2 View  11/28/2013   CLINICAL DATA:  Cough, fever, leukocytosis  EXAM: CHEST  2 VIEW  COMPARISON:  11/27/2013  FINDINGS: Lungs are clear.  No pleural effusion or pneumothorax.  The heart is normal in size.  Mild degenerative changes of the visualized thoracolumbar spine.  Right shoulder arthroplasty.  IMPRESSION: No evidence of acute cardiopulmonary disease.   Electronically Signed   By: Julian Hy M.D.   On: 11/28/2013 08:37   Dg Chest 2 View  11/27/2013   CLINICAL DATA:  Cough and weakness. High blood pressure. Nonsmoker.  EXAM: CHEST  2 VIEW  COMPARISON:  02/16/2012.  FINDINGS: No infiltrate, congestive heart failure or pneumothorax.  Mildly tortuous aorta.  Heart top-normal size.  No plain film evidence of pulmonary malignancy. If there is a persistent unexplained cough followup imaging may be considered.  Post right shoulder replacement.  IMPRESSION: No  acute abnormality.  Please see above.   Electronically Signed   By: Chauncey Cruel M.D.   On: 11/27/2013 13:08   Ct Head Wo Contrast  11/27/2013   CLINICAL DATA:  63 year old female with headache, nausea and dizziness.  EXAM: CT HEAD WITHOUT CONTRAST  TECHNIQUE: Contiguous axial images were obtained from the base of the skull through the vertex without intravenous contrast.  COMPARISON:  None.  FINDINGS: Chronic ischemic changes versus remote infarct in the high right frontal region again noted.  No acute intracranial abnormalities are identified, including mass lesion or mass effect, hydrocephalus, extra-axial  fluid collection, midline shift, hemorrhage, or acute infarction.  The visualized bony calvarium is unremarkable.  IMPRESSION: No evidence of acute intracranial abnormality.   Electronically Signed   By: Hassan Rowan M.D.   On: 11/27/2013 13:49   Dg Shoulder Left  11/27/2013   CLINICAL DATA:  Pain.  EXAM: LEFT SHOULDER - 2+ VIEW  COMPARISON:  Chest x-ray 11/27/2013.  FINDINGS: Mild acromioclavicular and glenohumeral degenerative change. No evidence of fracture, separation, or dislocation.  IMPRESSION: DJD, otherwise negative exam.   Electronically Signed   By: Marcello Moores  Register   On: 11/27/2013 16:43   Other results:  EKG: Normal sinus rhythm at 100 beats per minute, normal axis, normal intervals, possible left atrial enlargement, no significant Q waves, no LVH by voltage, nonspecific ST-T changes, no comparisons immediately available.  Assessment & Plan by Problem:  Katrina Gardner is a 63 year old woman with a past medical history of hypertension, asthma, osteoarthritis, and acid reflux who presented with generalized malaise associated with fevers, sore throat, and headache. A rapid strep test was negative. She was hydrated overnight with improvement of her symptoms. In addition, her leukocytosis was improving. My suspicion is that she was suffering from a viral pharyngitis that resulted in the sore throat, leukocytosis, fevers, and generalized malaise. This process is running its course and she should return to normal soon. The differential includes other types of viral infections, but a viral meningitis is unlikely given her examination and rapid improvement. No obvious bacterial infections were found on evaluation.  1) Generalized weakness and malaise: Improving with IV hydration and time. We will provide her with lozenges for her sore throat. She was advised to drink plenty of fluids and get plenty of rest over the next 24-48 hours. She is to followup with her primary care provider at [facility] in [facility]. She knows to return to the hospital should her symptoms worsen.  2) Disposition: She is stable for discharge home today with followup in her primary care provider's office.

## 2013-11-28 NOTE — Progress Notes (Signed)
Subjective: Katrina Gardner is feeling much better today with increased energy and has been able to keep food down. She complains of throat pain today that has improved since yesterday. She denies fever and night sweats.   Objective: Vital signs in last 24 hours: Filed Vitals:   11/27/13 1838 11/27/13 2146 11/28/13 0413 11/28/13 0810  BP: 139/86 127/74 146/78   Pulse: 71 70 75   Temp: 98 F (36.7 C) 97.5 F (36.4 C) 97.9 F (36.6 C)   TempSrc: Oral Oral Oral   Resp: 18 18 17    Height: 5\' 3"  (1.6 m)     Weight: 77 kg (169 lb 12.1 oz)     SpO2: 96% 95% 96% 97%   Intake/Output Summary (Last 24 hours) at 11/28/13 1113 Last data filed at 11/28/13 0920  Gross per 24 hour  Intake 713.33 ml  Output      0 ml  Net 713.33 ml   General appearance: Well developed, well nourished woman sitting in chair in no acute distress  Throat: no erythema or exudates  Lungs: CTAB Heart: RRR, S1, S2 normal, no murmur, click, rub or gallop  Abdomen: soft, non-tender; bowel sounds normal   Lab Results: Basic Metabolic Panel:  Recent Labs Lab 11/27/13 1111 11/28/13 0530  NA 138 141  K 3.8 3.9  CL 98 103  CO2 24 25  GLUCOSE 108* 88  BUN 11 10  CREATININE 0.89 0.83  CALCIUM 9.7 8.7   Liver Function Tests:  Recent Labs Lab 11/27/13 1111  AST 22  ALT 28  ALKPHOS 96  BILITOT 0.5  PROT 8.5*  ALBUMIN 4.3   CBC:  Recent Labs Lab 11/27/13 1111 11/28/13 0530  WBC 20.3* 14.5*  NEUTROABS 18.2*  --   HGB 14.0 11.9*  HCT 39.4 34.9*  MCV 79.1 81.2  PLT 250 227    Recent Labs Lab 11/27/13 1354  GLUCAP 106*   Hemoglobin A1C:  Recent Labs Lab 11/27/13 1953  HGBA1C 6.2*   Urinalysis:  Recent Labs Lab 11/27/13 1407  COLORURINE YELLOW  LABSPEC 1.011  PHURINE 6.0  GLUCOSEU NEGATIVE  HGBUR NEGATIVE  BILIRUBINUR NEGATIVE  KETONESUR NEGATIVE  PROTEINUR NEGATIVE  UROBILINOGEN 0.2  NITRITE NEGATIVE  LEUKOCYTESUR NEGATIVE   Micro Results: Recent Results (from the past 240  hour(s))  RAPID STREP SCREEN     Status: None   Collection Time    11/27/13 12:25 PM      Result Value Ref Range Status   Streptococcus, Group A Screen (Direct) NEGATIVE  NEGATIVE Final   Comment: (NOTE)     A Rapid Antigen test may result negative if the antigen level in the     sample is below the detection level of this test. The FDA has not     cleared this test as a stand-alone test therefore the rapid antigen     negative result has reflexed to a Group A Strep culture.   Studies/Results: Dg Chest 2 View  11/28/2013   CLINICAL DATA:  Cough, fever, leukocytosis  EXAM: CHEST  2 VIEW  COMPARISON:  11/27/2013  FINDINGS: Lungs are clear.  No pleural effusion or pneumothorax.  The heart is normal in size.  Mild degenerative changes of the visualized thoracolumbar spine.  Right shoulder arthroplasty.  IMPRESSION: No evidence of acute cardiopulmonary disease.   Electronically Signed   By: Charline BillsSriyesh  Krishnan M.D.   On: 11/28/2013 08:37   Dg Chest 2 View  11/27/2013   CLINICAL DATA:  Cough and weakness.  High blood pressure. Nonsmoker.  EXAM: CHEST  2 VIEW  COMPARISON:  02/16/2012.  FINDINGS: No infiltrate, congestive heart failure or pneumothorax.  Mildly tortuous aorta.  Heart top-normal size.  No plain film evidence of pulmonary malignancy. If there is a persistent unexplained cough followup imaging may be considered.  Post right shoulder replacement.  IMPRESSION: No acute abnormality.  Please see above.   Electronically Signed   By: Bridgett Larsson M.D.   On: 11/27/2013 13:08   Ct Head Wo Contrast  11/27/2013   CLINICAL DATA:  63 year old female with headache, nausea and dizziness.  EXAM: CT HEAD WITHOUT CONTRAST  TECHNIQUE: Contiguous axial images were obtained from the base of the skull through the vertex without intravenous contrast.  COMPARISON:  None.  FINDINGS: Chronic ischemic changes versus remote infarct in the high right frontal region again noted.  No acute intracranial abnormalities are  identified, including mass lesion or mass effect, hydrocephalus, extra-axial fluid collection, midline shift, hemorrhage, or acute infarction.  The visualized bony calvarium is unremarkable.  IMPRESSION: No evidence of acute intracranial abnormality.   Electronically Signed   By: Laveda Abbe M.D.   On: 11/27/2013 13:49   Dg Shoulder Left  11/27/2013   CLINICAL DATA:  Pain.  EXAM: LEFT SHOULDER - 2+ VIEW  COMPARISON:  Chest x-ray 11/27/2013.  FINDINGS: Mild acromioclavicular and glenohumeral degenerative change. No evidence of fracture, separation, or dislocation.  IMPRESSION: DJD, otherwise negative exam.   Electronically Signed   By: Maisie Fus  Register   On: 11/27/2013 16:43   Medications: I have reviewed the patient's current medications. Scheduled Meds: . sodium chloride   Intravenous STAT  . amLODipine  5 mg Oral Daily  . enoxaparin (LOVENOX) injection  40 mg Subcutaneous Q24H  . famotidine  20 mg Oral QHS  . fluticasone  2 puff Inhalation BID  . menthol-cetylpyridinium  1 lozenge Oral TID   Continuous Infusions:  PRN Meds:.acetaminophen, acetaminophen, albuterol, ondansetron (ZOFRAN) IV, ondansetron, phenol Assessment/Plan:  In summary, Ms. Eble is a 63 y.o. F with PMHx of HTN, asthma, DJD and GERD who presents with generalized weakness who was found to have acute bronchitis with likely viral etiology and resolving leukocytosis.   Acute Bronchitis: Likely viral etiology. Leukocytosis resolving with WBC 14.5 today down from 20.3 yesterday. Rapid strep negative. Pt afebrile overnight and feeling much better today. - Strep culture and blood cultures x2 pending - Zofran prn nausea  - Chloraseptic spray for throat pain  Asthma: Denies any current shortness of breath or wheezing. On albuterol 2 puffs Q6H prn wheezing and beclomethasone 80 mcg 2 puffs BID prn shortness of breath at home.  - Continue home albuterol 2 puffs Q6H prn wheezing  - Fluticasone 44 mcg 2 puffs BID  HTN:  - Continue  home amlodipine 5 mg daily.   GERD: On ranitidine 150 mg prn heartburn at home.  -Famotidine 20 mg daily   DVT/PE ppx: - Lovenox 40 mg SQ daily.   Dispo: Plan for discharge today with outpatient follow-up for WBC  This is a Psychologist, occupational Note.  The care of the patient was discussed with Dr. Danella Penton and the assessment and plan formulated with their assistance.  Please see their attached note for official documentation of the daily encounter.   LOS: 1 day   Abundio Miu, Med Student 11/28/2013, 11:13 AM

## 2013-11-28 NOTE — Discharge Summary (Signed)
Name: Katrina Gardner MRN: [id number] DOB: [birth year] 63 y.o. PCP: Jefm Petty, MD  Date of Admission: 11/27/2013 11:02 AM Date of Discharge: 11/28/2013 Attending Physician: Dr. Eppie Gibson  Discharge Diagnosis: Principal Problem:   Acute bronchitis Active Problems:   Fever   Leukocytosis, unspecified   Prediabetes  Discharge Medications:   Medication List         albuterol 108 (90 BASE) MCG/ACT inhaler  Commonly known as:  PROVENTIL HFA;VENTOLIN HFA  Inhale 2 puffs into the lungs every 6 (six) hours as needed for wheezing or shortness of breath.     amLODipine 5 MG tablet  Commonly known as:  NORVASC  Take 5 mg by mouth daily.     beclomethasone 80 MCG/ACT inhaler  Commonly known as:  QVAR  Inhale 2 puffs into the lungs 2 (two) times daily as needed (for shortness of bretah).     chlorhexidine 0.12 % solution  Commonly known as:  PERIDEX  Use as directed 15 mLs in the mouth or throat 2 (two) times daily.     HYDROcodone-acetaminophen 7.5-325 mg/15 ml solution  Commonly known as:  HYCET  Take 10 mLs by mouth 4 (four) times daily as needed for moderate pain.     ranitidine 150 MG tablet  Commonly known as:  ZANTAC  Take 150 mg by mouth as needed for heartburn.     UNKNOWN TO PATIENT  Apply 1 application topically 2 (two) times daily as needed (for eczema). Eczema cream        Disposition and follow-up:   KatrinaGladys D Gardner was discharged from [facility] in stable condition.  At the hospital follow up visit please address:  1.  Please get a repeat CBC, pt was discharged with a WBC of 14.5.   2.  Pending labs/ test needing follow-up: Blood cultures x2 taken on 7/13  Follow-up Appointments:     Follow-up Information   Follow up with Wonda Cerise, MD On 12/04/2013. [facility] followup with physician assistant, Jeralene Huff, at 9:20 a.m. )    Specialty:  [facility]   Contact information:   [address] [place] [address] [phone]       Discharge Instructions: Discharge Instructions   Call MD for:  extreme fatigue    Complete by:  As directed      Call MD for:  persistant nausea and vomiting    Complete by:  As directed      Call MD for:  temperature >100.4    Complete by:  As directed            Consultations:  none  Procedures Performed:  Dg Chest 2 View  11/28/2013   CLINICAL DATA:  Cough, fever, leukocytosis  EXAM: CHEST  2 VIEW  COMPARISON:  11/27/2013  FINDINGS: Lungs are clear.  No pleural effusion or pneumothorax.  The heart is normal in size.  Mild degenerative changes of the visualized thoracolumbar spine.  Right shoulder arthroplasty.  IMPRESSION: No evidence of acute cardiopulmonary disease.   Electronically Signed   By: Julian Hy M.D.   On: 11/28/2013 08:37   Dg Chest 2 View  11/27/2013   CLINICAL DATA:  Cough and weakness. High blood pressure. Nonsmoker.  EXAM: CHEST  2 VIEW  COMPARISON:  02/16/2012.  FINDINGS: No infiltrate, congestive heart failure or pneumothorax.  Mildly tortuous aorta.  Heart top-normal size.  No plain film evidence of pulmonary malignancy. If there is a persistent unexplained  cough followup imaging may be considered.  Post right shoulder replacement.  IMPRESSION: No acute abnormality.  Please see above.   Electronically Signed   By: Chauncey Cruel M.D.   On: 11/27/2013 13:08   Ct Head Wo Contrast  11/27/2013   CLINICAL DATA:  63 year old female with headache, nausea and dizziness.  EXAM: CT HEAD WITHOUT CONTRAST  TECHNIQUE: Contiguous axial images were obtained from the base of the skull through the vertex without intravenous contrast.  COMPARISON:  None.  FINDINGS: Chronic ischemic changes versus remote infarct in the high right frontal region again noted.  No acute intracranial abnormalities are identified, including mass lesion or mass effect, hydrocephalus, extra-axial fluid collection, midline shift, hemorrhage, or acute infarction.  The  visualized bony calvarium is unremarkable.  IMPRESSION: No evidence of acute intracranial abnormality.   Electronically Signed   By: Hassan Rowan M.D.   On: 11/27/2013 13:49   Dg Shoulder Left  11/27/2013   CLINICAL DATA:  Pain.  EXAM: LEFT SHOULDER - 2+ VIEW  COMPARISON:  Chest x-ray 11/27/2013.  FINDINGS: Mild acromioclavicular and glenohumeral degenerative change. No evidence of fracture, separation, or dislocation.  IMPRESSION: DJD, otherwise negative exam.   Electronically Signed   By: Marcello Moores  Register   On: 11/27/2013 16:43   Admission HPI: Ms. Katrina Gardner is a 63 y.o. F with PMHx of HTN, asthma, DJD and GERD who presents with chief complaint of generalized weakness. Pt states she starting feeling badly last night, feeling feverish off and on, but never took her temperature. She developed a non-productive cough last night and also had increased frequency of urination. She also complains of general malaise, nausea, loss of appetite, and headache. Pt denies any recent hospitalizations. She denies any recent sick contacts. She has been taking all of her medications as prescribed.    Hospital Course by problem list: Principal Problem:   Acute bronchitis Active Problems:   Fever   Leukocytosis, unspecified   Prediabetes   Generalized weakness and malaise-- Pt complaining of recent non-productive cough, fever, sore throat and fatigue for 2 days. On admission pt was normotensive, not tachycardic or tachypneic. Leukocytosis with WBC of 20.3 that trended down to 14.5 day of discharge. Tmax of 100.7deg F during hospital admission with temperature of 99.6 deg F on d/c. Other causes of infection were ruled out. Rapid strep test negative, strep culture negative.  No recent hospitalization or sick contacts. No hemoptysis, sputum production or smoking history. CXR on admission showed no infiltrate, congestive heart failure or pneumothorax. No evidence of pulmonary malignancy. No concern for meningitis--no neck pain or  stiffness. CT head without contrast is negative for any acute abnormalities. UA negative for LE, nitrites, WBC, and bacteria. Pt denies dysuria, frequency or urgency. 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 Unlikely septic arthritis as there was no erythema or cellulitis noted along with normal range of movement. Recent pain in left shoulder as well. Left shoulder Xray on admission showed mild acromioclavicular and glenohumeral degenerative change. No evidence of fracture, separation, or dislocation. Pt had no evidence of infected wounds or ulcers on her skin. ESR elevated at 30. The next day after IVFs pt no longer felt weak and reported improvement in her sore throat. Likely pt was suffering from a viral pharyngitis that resulted in the above symptoms. Pt was d/c'd with peridex throat spray for sore throat.   Asthma: Pt uses albuterol 2 puffs Q6H prn wheezing and beclomethasone 80 mcg 2 puffs  BID prn shortness of breath at home. She did not require her albuterol inhaler during hospital course. She was given flovent 2 puffs BID in place of Qvar.   HTN: Continued home med amlodipine 5 mg daily with SBP ranging from 127-146.     Discharge Vitals:   BP 146/78  Pulse 75  Temp(Src) 97.9 F (36.6 C) (Oral)  Resp 17  Ht 5' 3"  (1.6 m)  Wt 169 lb 12.1 oz (77 kg)  BMI 30.08 kg/m2  SpO2 97%  Discharge Labs:  Results for orders placed during the hospital encounter of 11/27/13 (from the past 24 hour(s))  CBC     Status: Abnormal   Collection Time    11/28/13  5:30 AM      Result Value Ref Range   WBC 14.5 (*) 4.0 - 10.5 K/uL   RBC 4.30  3.87 - 5.11 MIL/uL   Hemoglobin 11.9 (*) 12.0 - 15.0 g/dL   HCT 34.9 (*) 36.0 - 46.0 %   MCV 81.2  78.0 - 100.0 fL   MCH 27.7  26.0 - 34.0 pg   MCHC 34.1  30.0 - 36.0 g/dL   RDW 15.9 (*) 11.5 - 15.5 %   Platelets 227  150 - 400 K/uL  BASIC METABOLIC PANEL     Status: Abnormal   Collection Time    11/28/13  5:30 AM        Result Value Ref Range   Sodium 141  137 - 147 mEq/L   Potassium 3.9  3.7 - 5.3 mEq/L   Chloride 103  96 - 112 mEq/L   CO2 25  19 - 32 mEq/L   Glucose, Bld 88  70 - 99 mg/dL   BUN 10  6 - 23 mg/dL   Creatinine, Ser 0.83  0.50 - 1.10 mg/dL   Calcium 8.7  8.4 - 10.5 mg/dL   GFR calc non Af Amer 73 (*) >90 mL/min   GFR calc Af Amer 85 (*) >90 mL/min   Anion gap 13  5 - 15  C-REACTIVE PROTEIN     Status: Abnormal   Collection Time    11/28/13  5:30 AM      Result Value Ref Range   CRP 7.2 (*) <0.60 mg/dL  TSH     Status: None   Collection Time    11/28/13  8:47 AM      Result Value Ref Range   TSH 1.310  0.350 - 4.500 uIU/mL    Signed: Julious Oka, MD 11/28/2013, 9:41 PM    Services Ordered on Discharge: none Equipment Ordered on Discharge: none

## 2013-11-28 NOTE — Discharge Instructions (Signed)
Please use peridex mouth spray for sore throat. Please keep your hospital followup appointment with physician assistant, Salomon Mast, at 9:20 a.m. On July 20th. If having severe chest pain or difficulty breathing go to the ED.   Viral Infections  A viral infection can be caused by different types of viruses.Most viral infections are not serious and resolve on their own. However, some infections may cause severe symptoms and may lead to further complications. SYMPTOMS Viruses can frequently cause:  Minor sore throat.  Aches and pains.  Headaches.  Runny nose.  Different types of rashes.  Watery eyes.  Tiredness.  Cough.  Loss of appetite.  Gastrointestinal infections, resulting in nausea, vomiting, and diarrhea. These symptoms do not respond to antibiotics because the infection is not caused by bacteria. However, you might catch a bacterial infection following the viral infection. This is sometimes called a "superinfection." Symptoms of such a bacterial infection may include:  Worsening sore throat with pus and difficulty swallowing.  Swollen neck glands.  Chills and a high or persistent fever.  Severe headache.  Tenderness over the sinuses.  Persistent overall ill feeling (malaise), muscle aches, and tiredness (fatigue).  Persistent cough.  Yellow, green, or brown mucus production with coughing. HOME CARE INSTRUCTIONS   Only take over-the-counter or prescription medicines for pain, discomfort, diarrhea, or fever as directed by your caregiver.  Drink enough water and fluids to keep your urine clear or pale yellow. Sports drinks can provide valuable electrolytes, sugars, and hydration.  Get plenty of rest and maintain proper nutrition. Soups and broths with crackers or rice are fine. SEEK IMMEDIATE MEDICAL CARE IF:   You have severe headaches, shortness of breath, chest pain, neck pain, or an unusual rash.  You have uncontrolled vomiting, diarrhea, or you are  unable to keep down fluids.  You or your child has an oral temperature above 102 F (38.9 C), not controlled by medicine.  Your baby is older than 3 months with a rectal temperature of 102 F (38.9 C) or higher.  Your baby is 27 months old or younger with a rectal temperature of 100.4 F (38 C) or higher. MAKE SURE YOU:   Understand these instructions.  Will watch your condition.  Will get help right away if you are not doing well or get worse. Document Released: 02/11/2005 Document Revised: 07/27/2011 Document Reviewed: 09/08/2010 [facility] Patient Information 2015 [place], [place]. This information is not intended to replace advice given to you by your health care provider. Make sure you discuss any questions you have with your health care provider.    Fever, Adult A fever is a higher than normal body temperature. In an adult, an oral temperature around 98.6 F (37 C) is considered normal. A temperature of 100.4 F (38 C) or higher is generally considered a fever. Mild or moderate fevers generally have no long-term effects and often do not require treatment. Extreme fever (greater than or equal to 106 F or 41.1 C) can cause seizures. The sweating that may occur with repeated or prolonged fever may cause dehydration. Elderly people can develop confusion during a fever. A measured temperature can vary with:  Age.  Time of day.  Method of measurement (mouth, underarm, rectal, or ear). The fever is confirmed by taking a temperature with a thermometer. Temperatures can be taken different ways. Some methods are accurate and some are not.  An oral temperature is used most commonly. Electronic thermometers are fast and accurate.  An ear temperature will only be  accurate if the thermometer is positioned as recommended by the manufacturer.  A rectal temperature is accurate and done for those adults who have a condition where an oral temperature cannot be taken.  An underarm (axillary)  temperature is not accurate and not recommended. Fever is a symptom, not a disease.  CAUSES   Infections commonly cause fever.  Some noninfectious causes for fever include:  Some arthritis conditions.  Some thyroid or adrenal gland conditions.  Some immune system conditions.  Some types of cancer.  A medicine reaction.  High doses of certain street drugs such as methamphetamine.  Dehydration.  Exposure to high outside or room temperatures.  Occasionally, the source of a fever cannot be determined. This is sometimes called a "fever of unknown origin" (FUO).  Some situations may lead to a temporary rise in body temperature that may go away on its own. Examples are:  Childbirth.  Surgery.  Intense exercise. HOME CARE INSTRUCTIONS   Take appropriate medicines for fever. Follow dosing instructions carefully. If you use acetaminophen to reduce the fever, be careful to avoid taking other medicines that also contain acetaminophen. Do not take aspirin for a fever if you are younger than age 30. There is an association with Reye's syndrome. Reye's syndrome is a rare but potentially deadly disease.  If an infection is present and antibiotics have been prescribed, take them as directed. Finish them even if you start to feel better.  Rest as needed.  Maintain an adequate fluid intake. To prevent dehydration during an illness with prolonged or recurrent fever, you may need to drink extra fluid.Drink enough fluids to keep your urine clear or pale yellow.  Sponging or bathing with room temperature water may help reduce body temperature. Do not use ice water or alcohol sponge baths.  Dress comfortably, but do not over-bundle. SEEK MEDICAL CARE IF:   You are unable to keep fluids down.  You develop vomiting or diarrhea.  You are not feeling at least partly better after 3 days.  You develop new symptoms or problems. SEEK IMMEDIATE MEDICAL CARE IF:   You have shortness of breath  or trouble breathing.  You develop excessive weakness.  You are dizzy or you faint.  You are extremely thirsty or you are making little or no urine.  You develop new pain that was not there before (such as in the head, neck, chest, back, or abdomen).  You have persistant vomiting and diarrhea for more than 1 to 2 days.  You develop a stiff neck or your eyes become sensitive to light.  You develop a skin rash.  You have a fever or persistent symptoms for more than 2 to 3 days.  You have a fever and your symptoms suddenly get worse. MAKE SURE YOU:   Understand these instructions.  Will watch your condition.  Will get help right away if you are not doing well or get worse. Document Released: 10/28/2000 Document Revised: 07/27/2011 Document Reviewed: 03/05/2011 [facility] Patient Information 2015 [place], [place]. This information is not intended to replace advice given to you by your health care provider. Make sure you discuss any questions you have with your health care provider.    Osteoarthritis Osteoarthritis is a disease that causes soreness and swelling (inflammation) of a joint. It occurs when the cartilage at the affected joint wears down. Cartilage acts as a cushion, covering the ends of bones where they meet to form a joint. Osteoarthritis is the most common form of arthritis. It often occurs  in older people. The joints affected most often by this condition include those in the:  Ends of the fingers.  Thumbs.  Neck.  Lower back.  Knees.  Hips. CAUSES  Over time, the cartilage that covers the ends of bones begins to wear away. This causes bone to rub on bone, producing pain and stiffness in the affected joints.  RISK FACTORS Certain factors can increase your chances of having osteoarthritis, including:  Older age.  Excessive body weight.  Overuse of joints. SIGNS AND SYMPTOMS   Pain, swelling, and stiffness in the joint.  Over time, the joint may lose its  normal shape.  Small deposits of bone (osteophytes) may grow on the edges of the joint.  Bits of bone or cartilage can break off and float inside the joint space. This may cause more pain and damage. DIAGNOSIS  Your health care provider will do a physical exam and ask about your symptoms. Various tests may be ordered, such as:  X-rays of the affected joint.  An MRI scan.  Blood tests to rule out other types of arthritis.  Joint fluid tests. This involves using a needle to draw fluid from the joint and examining the fluid under a microscope. TREATMENT  Goals of treatment are to control pain and improve joint function. Treatment plans may include:  A prescribed exercise program that allows for rest and joint relief.  A weight control plan.  Pain relief techniques, such as:  Properly applied heat and cold.  Electric pulses delivered to nerve endings under the skin (transcutaneous electrical nerve stimulation, TENS).  Massage.  Certain nutritional supplements.  Medicines to control pain, such as:  Acetaminophen.  Nonsteroidal anti-inflammatory drugs (NSAIDs), such as naproxen.  Narcotic or central-acting agents, such as tramadol.  Corticosteroids. These can be given orally or as an injection.  Surgery to reposition the bones and relieve pain (osteotomy) or to remove loose pieces of bone and cartilage. Joint replacement may be needed in advanced states of osteoarthritis. HOME CARE INSTRUCTIONS   Only take over-the-counter or prescription medicines as directed by your health care provider. Take all medicines exactly as instructed.  Maintain a healthy weight. Follow your health care provider's instructions for weight control. This may include dietary instructions.  Exercise as directed. Your health care provider can recommend specific types of exercise. These may include:  Strengthening exercises--These are done to strengthen the muscles that support joints affected by  arthritis. They can be performed with weights or with exercise bands to add resistance.  Aerobic activities--These are exercises, such as brisk walking or low-impact aerobics, that get your heart pumping.  Range-of-motion activities--These keep your joints limber.  Balance and agility exercises--These help you maintain daily living skills.  Rest your affected joints as directed by your health care provider.  Follow up with your health care provider as directed. SEEK MEDICAL CARE IF:   Your skin turns red.  You develop a rash in addition to your joint pain.  You have worsening joint pain. SEEK IMMEDIATE MEDICAL CARE IF:  You have a significant loss of weight or appetite.  You have a fever along with joint or muscle aches.  You have night sweats. FOR MORE INFORMATION  National Institute of Arthritis and Musculoskeletal and Skin Diseases: www.niams.http://www.myers.net/nih.gov [employer]: https://walker.com/www.nia.nih.gov American College of Rheumatology: www.rheumatology.org Document Released: 05/04/2005 Document Revised: 02/22/2013 Document Reviewed: 01/09/2013 [facility] Patient Information 2015 [place], [place]. This information is not intended to replace advice given to you by your health care  provider. Make sure you discuss any questions you have with your health care provider.

## 2013-11-28 NOTE — Progress Notes (Signed)
Patient was discharged home by MD order; discharged instructions  review and give to patient with care notes; IV DIC; skin intact; patient will be escorted to the car by volunteer via wheelchair.  

## 2013-11-28 NOTE — Progress Notes (Signed)
  I have seen and examined the patient, and reviewed the daily progress note by Delford Fieldhristine Jackson, MS 3 and discussed the care of the patient with them. Please see my progress note from 11/28/2013 for further details regarding assessment and plan.    Signed:  Gara Kroneriana Alisson Rozell, MD 11/28/2013, 2:50 PM

## 2013-11-28 NOTE — Progress Notes (Signed)
Subjective: Pt having sore throat that started yesterday with improvement today. States she is no longer fatigued and feeling much better today.  Objective: Vital signs in last 24 hours: Filed Vitals:   11/27/13 1838 11/27/13 2146 11/28/13 0413 11/28/13 0810  BP: 139/86 127/74 146/78   Pulse: 71 70 75   Temp: 98 F (36.7 C) 97.5 F (36.4 C) 97.9 F (36.6 C)   TempSrc: Oral Oral Oral   Resp: 18 18 17    Height: 5\' 3"  (1.6 m)     Weight: 169 lb 12.1 oz (77 kg)     SpO2: 96% 95% 96% 97%    Intake/Output Summary (Last 24 hours) at 11/28/13 1018 Last data filed at 11/28/13 0920  Gross per 24 hour  Intake 713.33 ml  Output      0 ml  Net 713.33 ml   General appearance: alert, cooperative and no distress Throat: non erythematous, no exudates Lungs: clear to auscultation bilaterally Heart: regular rate and rhythm, S1, S2 normal, no murmur, click, rub or gallop Abdomen: soft, non-tender; bowel sounds normal; no masses,  no organomegaly Skin: Skin color, texture, turgor normal. Dry and warm  Lab Results: Basic Metabolic Panel:  Recent Labs Lab 11/27/13 1111 11/28/13 0530  NA 138 141  K 3.8 3.9  CL 98 103  CO2 24 25  GLUCOSE 108* 88  BUN 11 10  CREATININE 0.89 0.83  CALCIUM 9.7 8.7   Liver Function Tests:  Recent Labs Lab 11/27/13 1111  AST 22  ALT 28  ALKPHOS 96  BILITOT 0.5  PROT 8.5*  ALBUMIN 4.3  CBC:  Recent Labs Lab 11/27/13 1111 11/28/13 0530  WBC 20.3* 14.5*  NEUTROABS 18.2*  --   HGB 14.0 11.9*  HCT 39.4 34.9*  MCV 79.1 81.2  PLT 250 227  CBG:  Recent Labs Lab 11/27/13 1354  GLUCAP 106*   Hemoglobin A1C:  Recent Labs Lab 11/27/13 1953  HGBA1C 6.2*   Urinalysis:  Recent Labs Lab 11/27/13 1407  COLORURINE YELLOW  LABSPEC 1.011  PHURINE 6.0  GLUCOSEU NEGATIVE  HGBUR NEGATIVE  BILIRUBINUR NEGATIVE  KETONESUR NEGATIVE  PROTEINUR NEGATIVE  UROBILINOGEN 0.2  NITRITE NEGATIVE  LEUKOCYTESUR NEGATIVE     Micro  Results: Recent Results (from the past 240 hour(s))  RAPID STREP SCREEN     Status: None   Collection Time    11/27/13 12:25 PM      Result Value Ref Range Status   Streptococcus, Group A Screen (Direct) NEGATIVE  NEGATIVE Final   Comment: (NOTE)     A Rapid Antigen test may result negative if the antigen level in the     sample is below the detection level of this test. The FDA has not     cleared this test as a stand-alone test therefore the rapid antigen     negative result has reflexed to a Group A Strep culture.   Studies/Results: Dg Chest 2 View  11/28/2013   CLINICAL DATA:  Cough, fever, leukocytosis  EXAM: CHEST  2 VIEW  COMPARISON:  11/27/2013  FINDINGS: Lungs are clear.  No pleural effusion or pneumothorax.  The heart is normal in size.  Mild degenerative changes of the visualized thoracolumbar spine.  Right shoulder arthroplasty.  IMPRESSION: No evidence of acute cardiopulmonary disease.   Electronically Signed   By: Charline Bills M.D.   On: 11/28/2013 08:37   Dg Chest 2 View  11/27/2013   CLINICAL DATA:  Cough and weakness. High blood pressure.  Nonsmoker.  EXAM: CHEST  2 VIEW  COMPARISON:  02/16/2012.  FINDINGS: No infiltrate, congestive heart failure or pneumothorax.  Mildly tortuous aorta.  Heart top-normal size.  No plain film evidence of pulmonary malignancy. If there is a persistent unexplained cough followup imaging may be considered.  Post right shoulder replacement.  IMPRESSION: No acute abnormality.  Please see above.   Electronically Signed   By: Bridgett LarssonSteve  Olson M.D.   On: 11/27/2013 13:08   Ct Head Wo Contrast  11/27/2013   CLINICAL DATA:  63 year old female with headache, nausea and dizziness.  EXAM: CT HEAD WITHOUT CONTRAST  TECHNIQUE: Contiguous axial images were obtained from the base of the skull through the vertex without intravenous contrast.  COMPARISON:  None.  FINDINGS: Chronic ischemic changes versus remote infarct in the high right frontal region again noted.   No acute intracranial abnormalities are identified, including mass lesion or mass effect, hydrocephalus, extra-axial fluid collection, midline shift, hemorrhage, or acute infarction.  The visualized bony calvarium is unremarkable.  IMPRESSION: No evidence of acute intracranial abnormality.   Electronically Signed   By: Laveda AbbeJeff  Hu M.D.   On: 11/27/2013 13:49   Dg Shoulder Left  11/27/2013   CLINICAL DATA:  Pain.  EXAM: LEFT SHOULDER - 2+ VIEW  COMPARISON:  Chest x-ray 11/27/2013.  FINDINGS: Mild acromioclavicular and glenohumeral degenerative change. No evidence of fracture, separation, or dislocation.  IMPRESSION: DJD, otherwise negative exam.   Electronically Signed   By: Maisie Fushomas  Register   On: 11/27/2013 16:43   Medications: I have reviewed the patient's current medications. Scheduled Meds: . sodium chloride   Intravenous STAT  . amLODipine  5 mg Oral Daily  . enoxaparin (LOVENOX) injection  40 mg Subcutaneous Q24H  . famotidine  20 mg Oral QHS  . fluticasone  2 puff Inhalation BID  . menthol-cetylpyridinium  1 lozenge Oral TID   Continuous Infusions:  PRN Meds:.acetaminophen, acetaminophen, albuterol, ondansetron (ZOFRAN) IV, ondansetron Assessment/Plan: Principal Problem:   Acute bronchitis Active Problems:   Fever   Leukocytosis, unspecified   Prediabetes  Acute Bronchitis likely viral etiology:  Leukocytosis with WBC of 20.3 resolving, today WBC 14.5. Pt afebrile overnight. Rapid strep negative.  - Pt feeling much better today and feels like she is ready to go home. --Strep culture pending  -Blood cultures x 2 pending  -Zofran prn nausea  - chloraseptic spray for sore throat  Asthma: Denies any current shortness of breath or wheezing. On albuterol 2 puffs Q6H prn wheezing and beclomethasone 80 mcg 2 puffs BID prn shortness of breath at home.  - continue home meeds  HTN:  On amlodipine 5 mg daily at home.  - continue home meds.   GERD:  On ranitidine 150 mg prn heartburn at  home.  -Famotidine 20 mg daily   Eczema: Pt uses a topical cream at home BID for eczema. Eczematous lesion on medial lower left extremity. Resolving.  -Hold for now   DVT/PE ppx: Lovenox 40 mg SQ daily.     Dispo: Disposition is deferred at this time, awaiting improvement of current medical problems.  Anticipated discharge in today  The patient does have a current PCP Loyal Jacobson(Michael Kalish, MD) and [facility] after discharge.   .Services Needed at time of discharge: Y = Yes, Blank = No PT:   OT:   RN:   Equipment:   Other:     LOS: 1 day   Gara Kroneriana Truong, MD 11/28/2013, 10:18 AM

## 2013-11-29 LAB — CULTURE, GROUP A STREP

## 2013-11-29 NOTE — ED Provider Notes (Signed)
Medical screening examination/treatment/procedure(s) were conducted as a shared visit with non-physician practitioner(s) and myself.  I personally evaluated the patient during the encounter.   EKG Interpretation   Date/Time:  Monday November 27 2013 12:24:32 EDT Ventricular Rate:  99 PR Interval:  163 QRS Duration: 73 QT Interval:  345 QTC Calculation: 443 R Axis:   60 Text Interpretation:  Sinus rhythm Consider left atrial enlargement  Borderline T wave abnormalities No significant change since last tracing  Confirmed by Denton LankSTEINL  MD, Caryn BeeKEVIN ([id number]) on 11/27/2013 12:30:54 PM      See prior note.  Pt c/o fevers, headache, sore throat, x 1-2 days. Also c/o left shoulder pain/stiffness x 1 month. During eval, marked leucocytosis, left shift. Blood cxs and additional labs ordered.  On recheck pt feels near syncopal/faint when stands. bp normal. Will give ivf, admit for obs/expectant management. On exam, no neck stiffness or rigidity, good passive rom left shoulder w no findings to suggest septic arthritis. abd soft nt.   Suzi RootsKevin E Seanne Chirico, MD 11/29/13 [phone]

## 2013-12-04 LAB — CULTURE, BLOOD (ROUTINE X 2)
CULTURE: NO GROWTH
Culture: NO GROWTH

## 2014-03-01 ENCOUNTER — Other Ambulatory Visit (HOSPITAL_COMMUNITY): Payer: Self-pay | Admitting: Pharmacist

## 2014-03-01 DIAGNOSIS — Z1231 Encounter for screening mammogram for malignant neoplasm of breast: Secondary | ICD-10-CM

## 2014-03-07 ENCOUNTER — Ambulatory Visit (HOSPITAL_COMMUNITY)
Admission: RE | Admit: 2014-03-07 | Discharge: 2014-03-07 | Disposition: A | Discharge status: Home / Self Care | Payer: No Typology Code available for payment source | Source: Ambulatory Visit | Attending: Internal Medicine | Admitting: Internal Medicine

## 2014-03-07 DIAGNOSIS — Z1231 Encounter for screening mammogram for malignant neoplasm of breast: Secondary | ICD-10-CM | POA: Insufficient documentation

## 2015-05-21 ENCOUNTER — Other Ambulatory Visit: Payer: Self-pay

## 2015-05-21 DIAGNOSIS — Z1231 Encounter for screening mammogram for malignant neoplasm of breast: Secondary | ICD-10-CM

## 2015-06-02 IMAGING — CR DG SHOULDER 2+V*L*
4 series · 4 of 4 positions shown · non-contrast
Comparison: Chest x-ray 11/27/2013.

CLINICAL DATA: Pain.

EXAM:
LEFT SHOULDER - 2+ VIEW

[w shoulder ap external left]
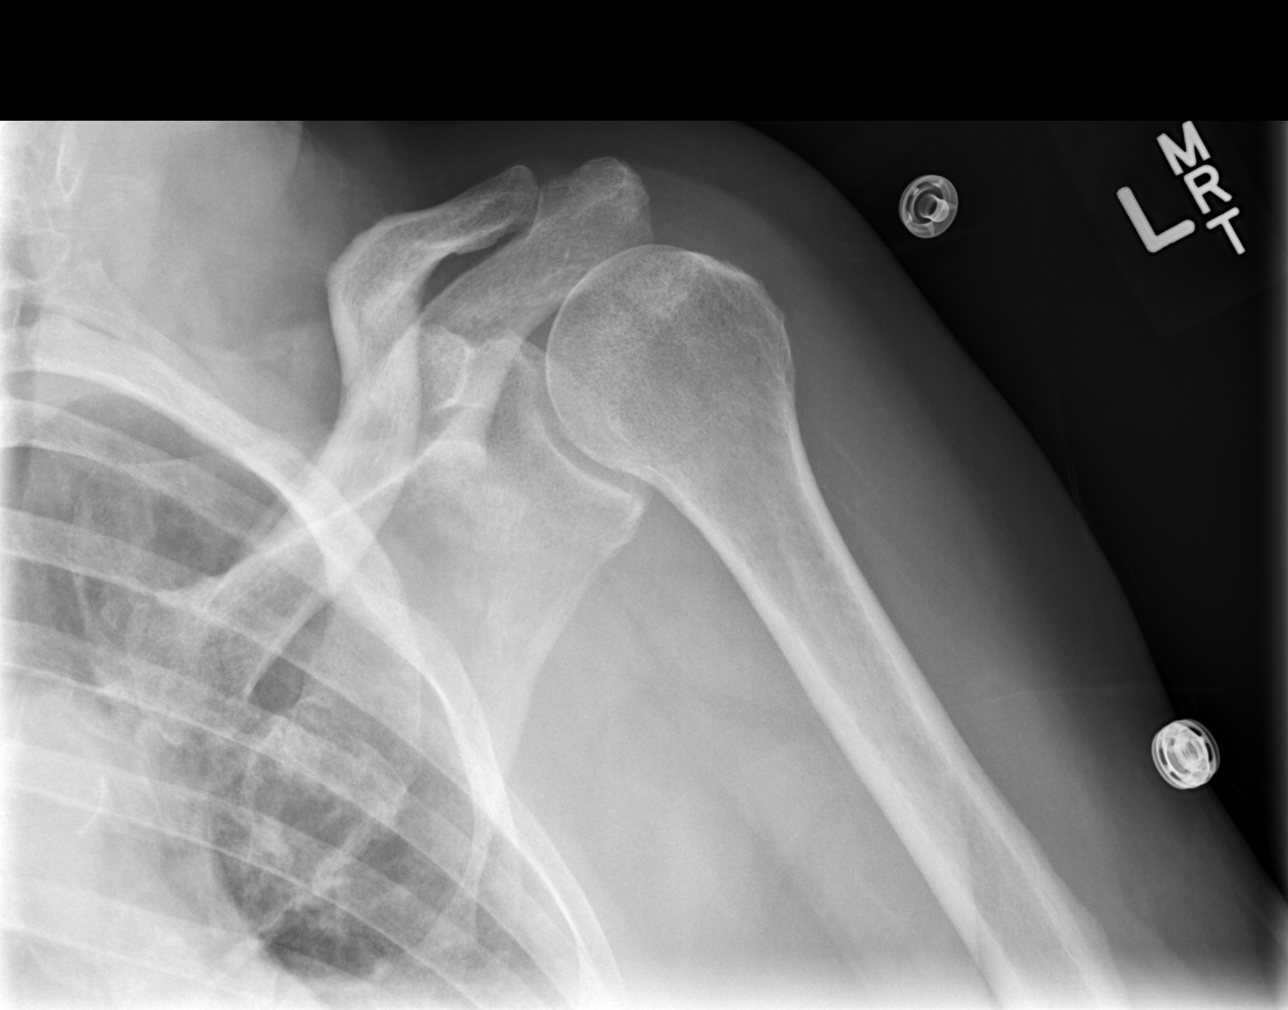

[w shoulder ap external left *]
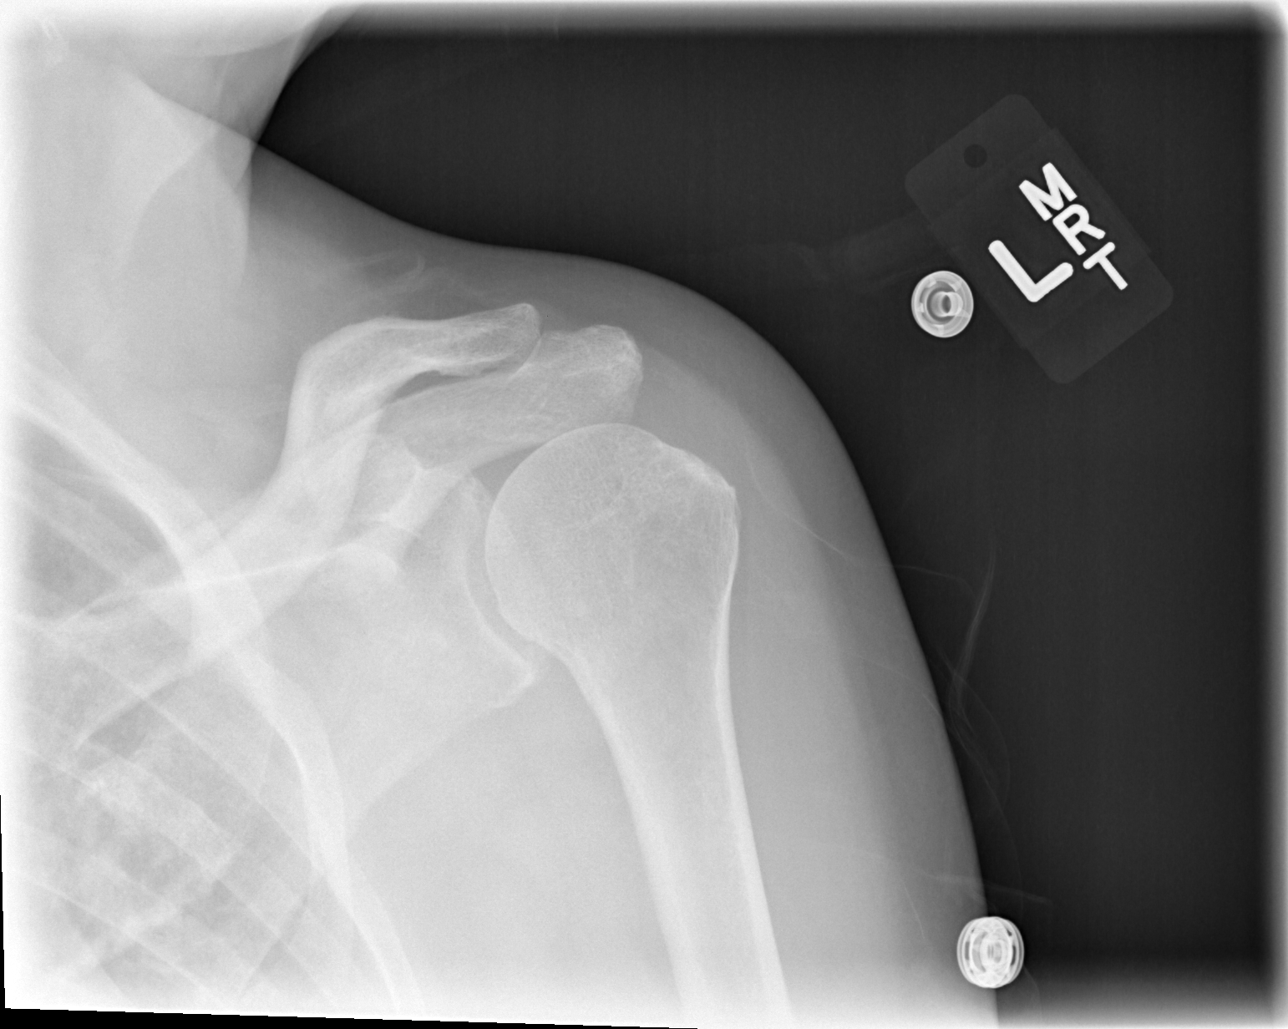

[w shoulder y view left *]
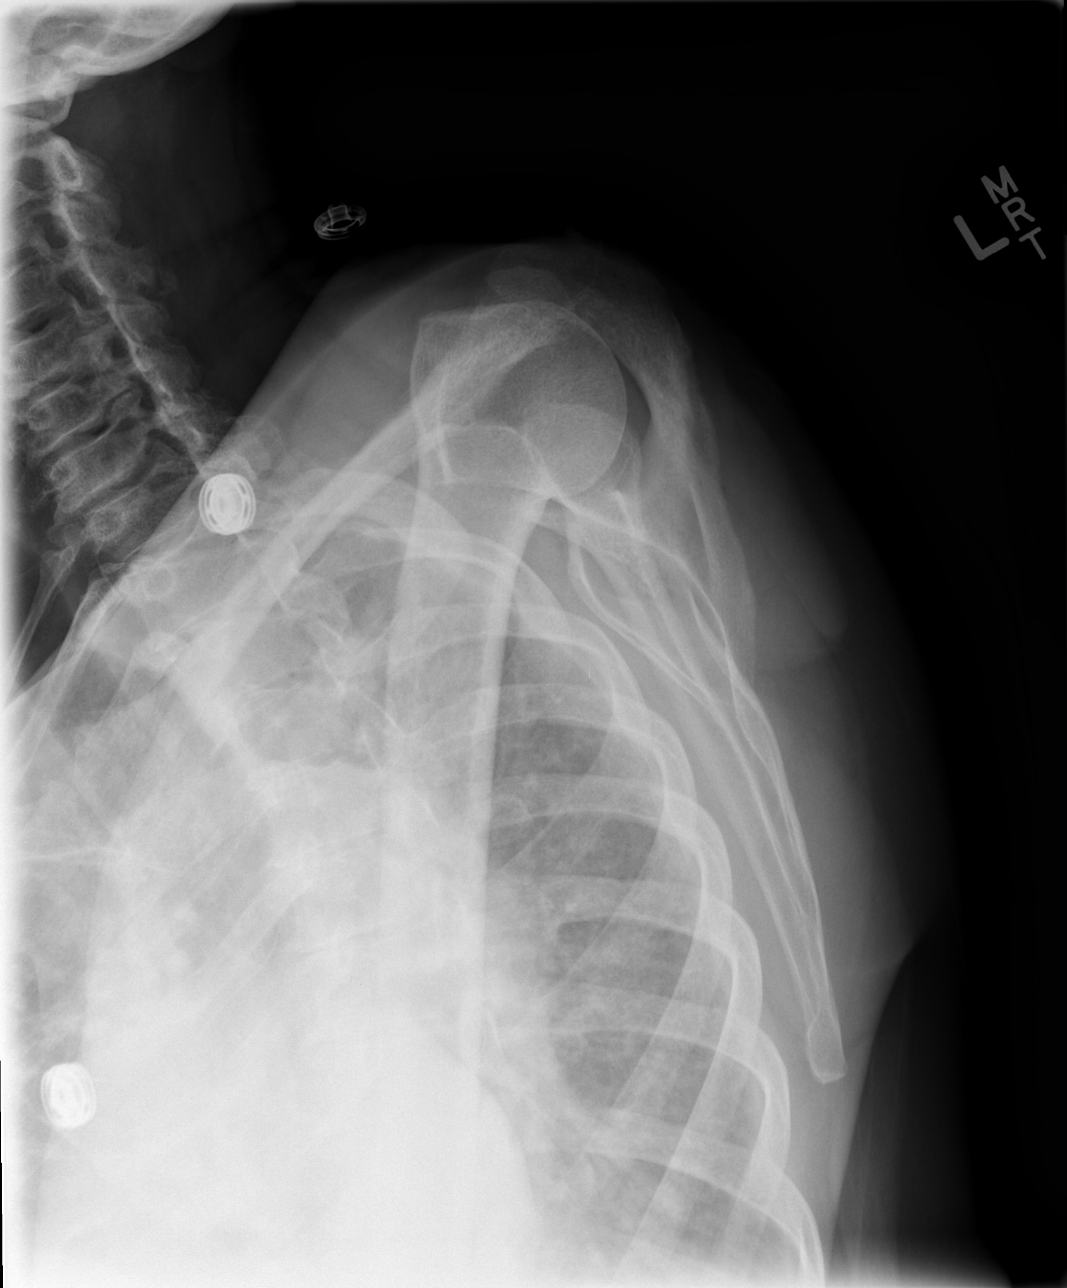

[x shoulder axillary left]
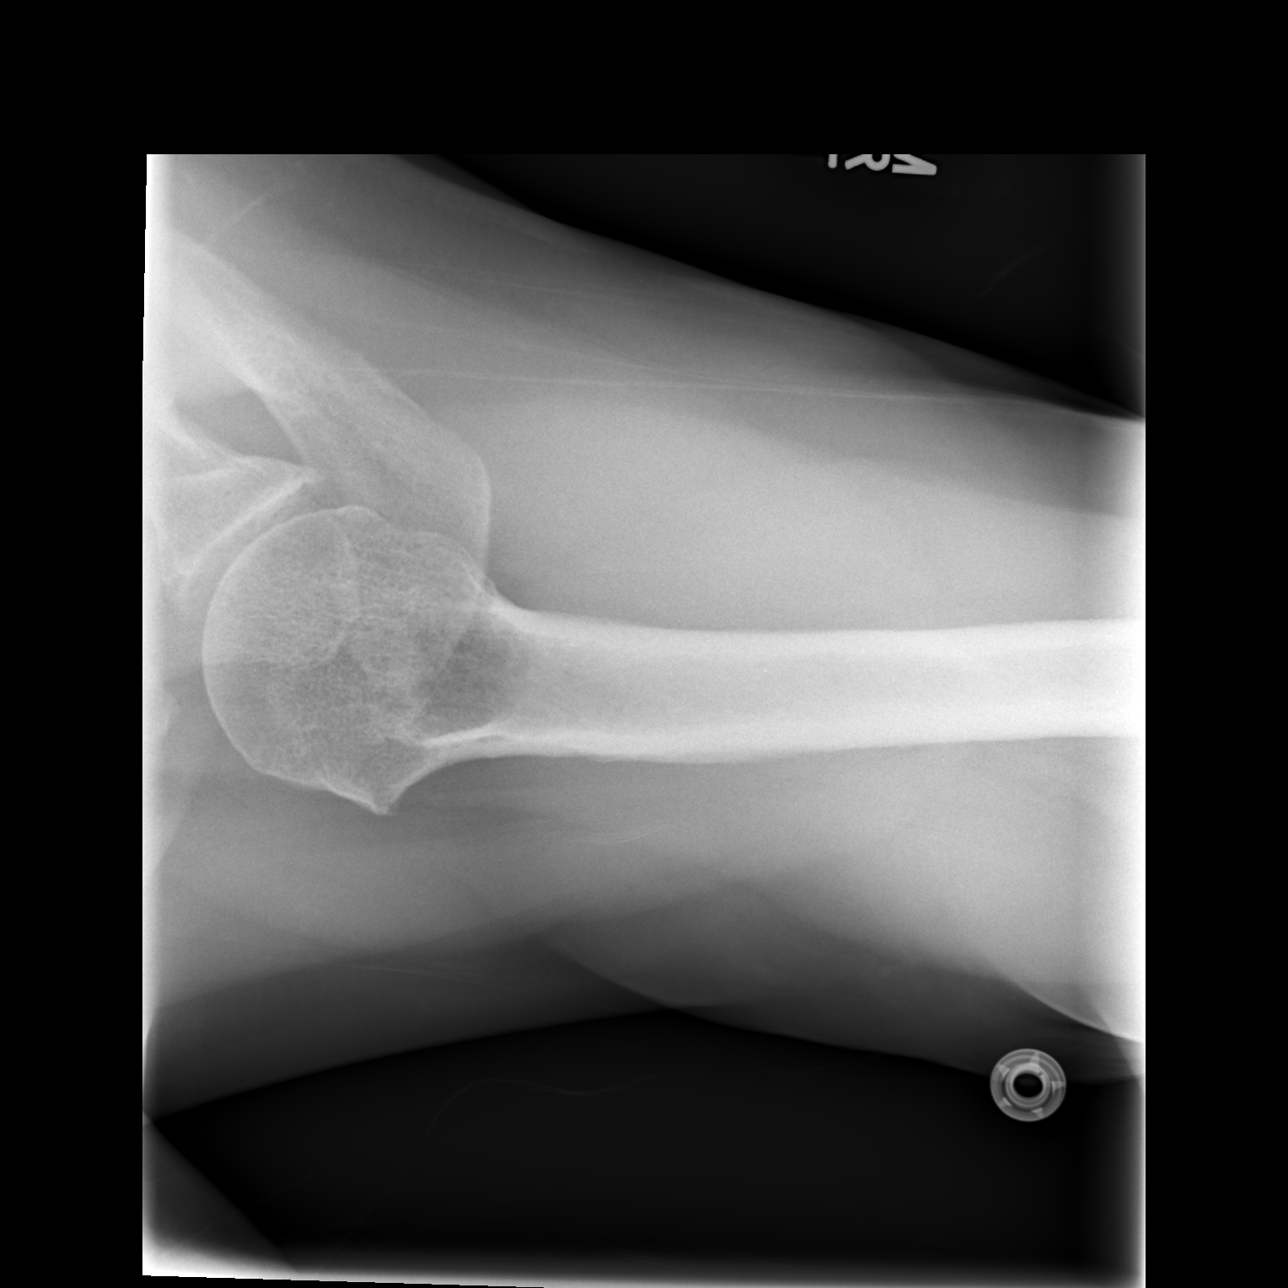

[4 of 4 positions shown; findings below may reference images not displayed]

FINDINGS: Mild acromioclavicular and glenohumeral degenerative change. No
evidence of fracture, separation, or dislocation.
IMPRESSION: DJD, otherwise negative exam.

## 2015-06-02 IMAGING — CT CT HEAD W/O CM
1 series · 16 of 29 positions shown, 20 images · non-contrast
Comparison: None.

CLINICAL DATA: 63-year-old female with headache, nausea and
dizziness.

EXAM:
CT HEAD WITHOUT CONTRAST
TECHNIQUE: Contiguous axial images were obtained from the base of the skull
through the vertex without intravenous contrast.

[Series 2: head 5.0 h30s · axial · 0.41mm/px · z∈[+1382,+1512]mm · 16 of 29 slices shown, 20 images]
[im 2/29  brain]
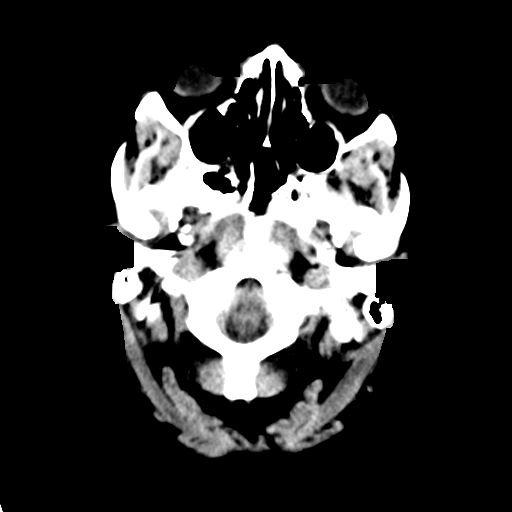
[im 2/29  bone]
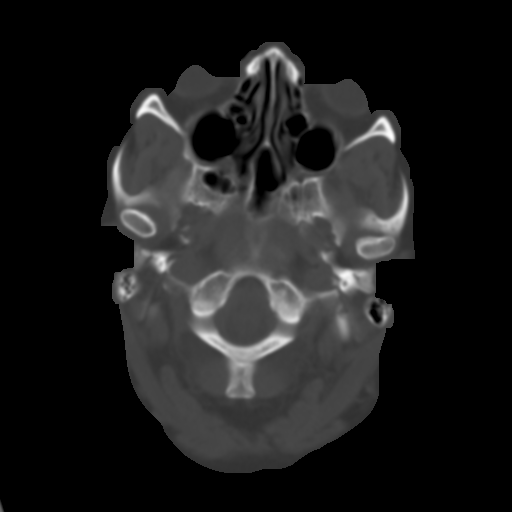
[im 4/29  brain]
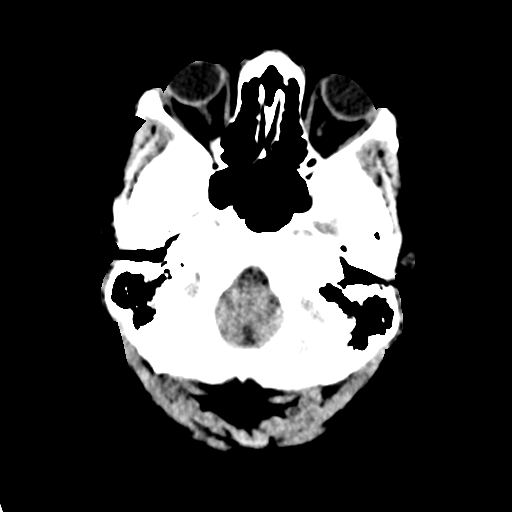
[im 6/29  brain]
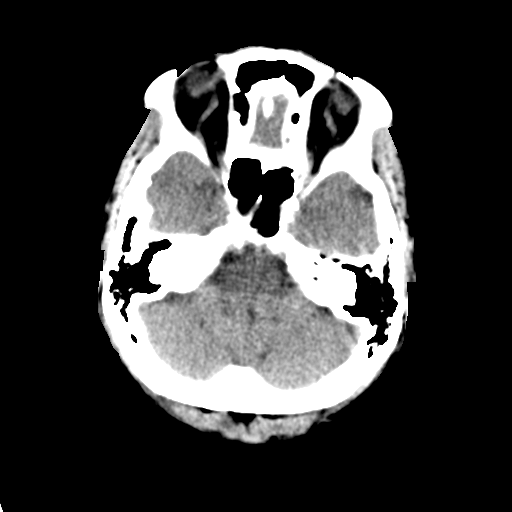
[im 7/29  brain]
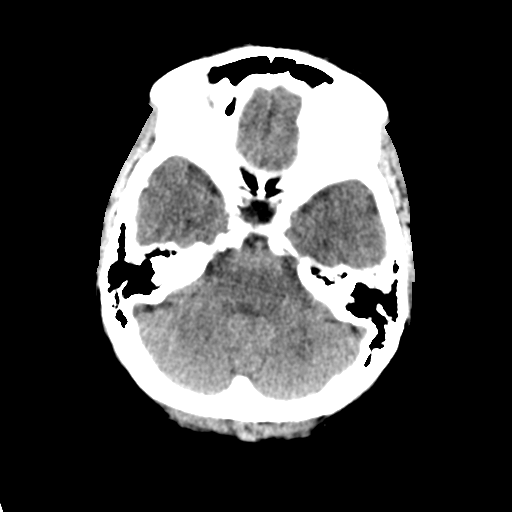
[im 9/29  brain]
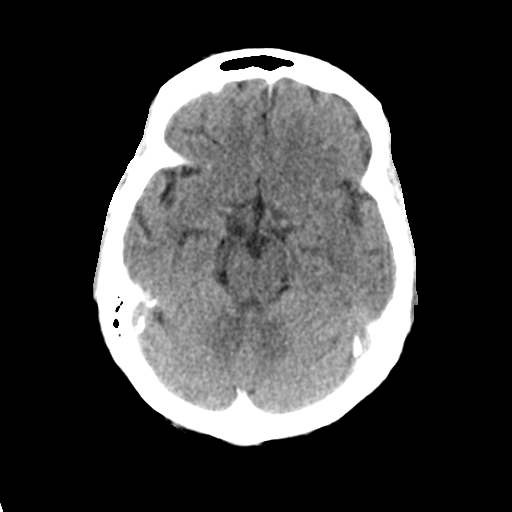
[im 9/29  bone]
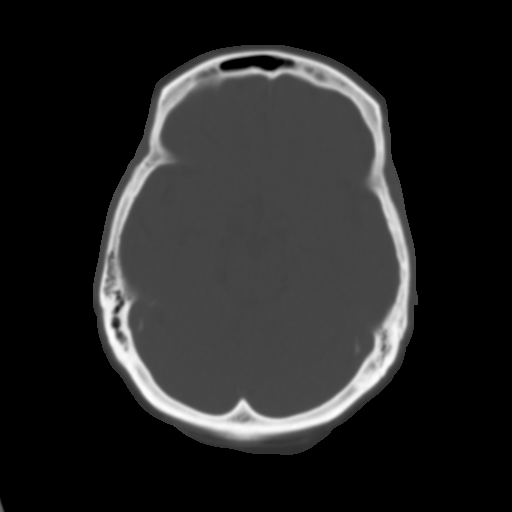
[im 11/29  brain]
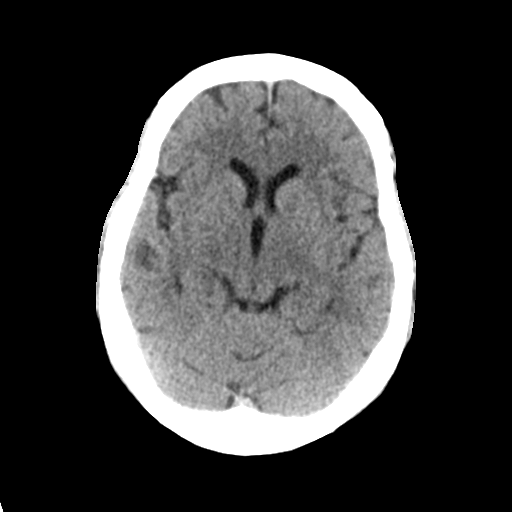
[im 12/29  brain]
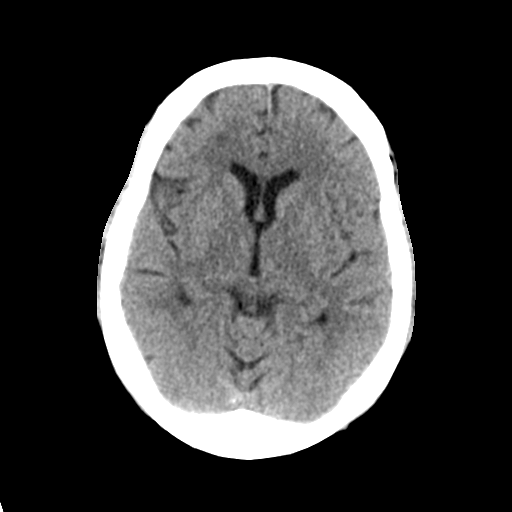
[im 14/29  brain]
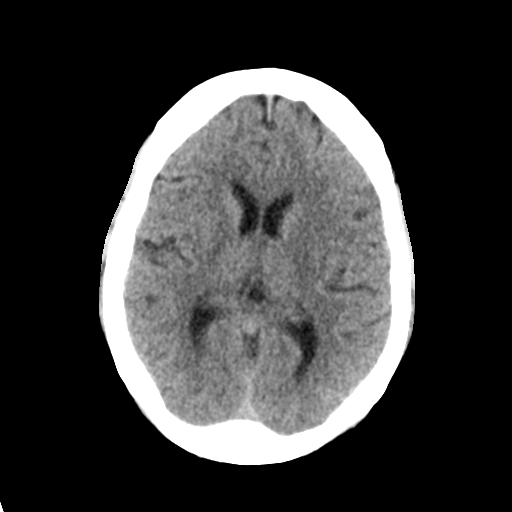
[im 16/29  brain]
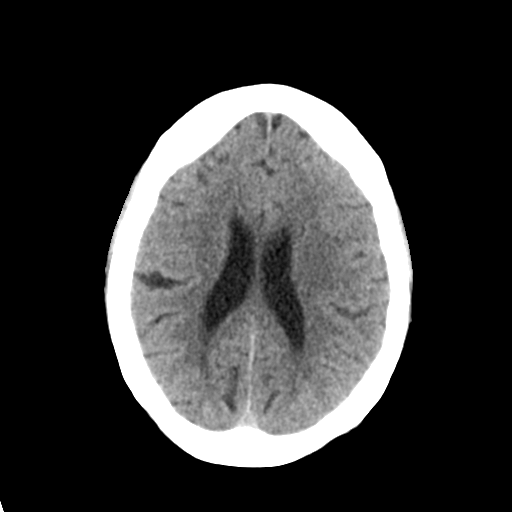
[im 16/29  bone]
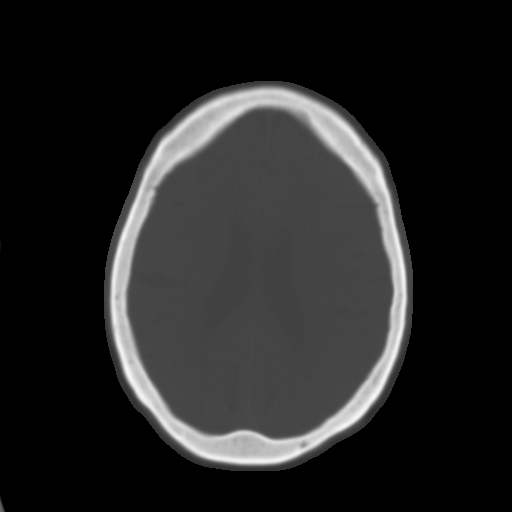
[im 18/29  brain]
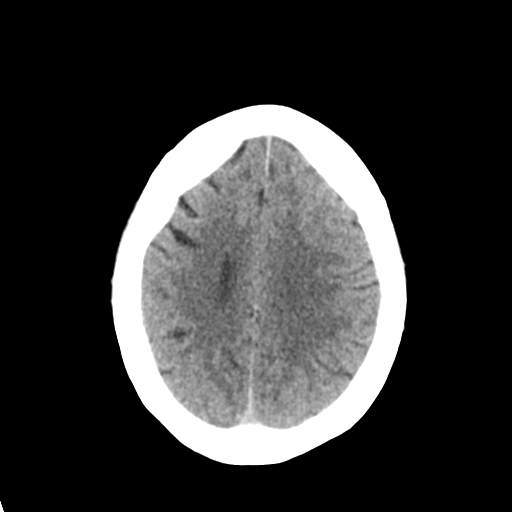
[im 19/29  brain]
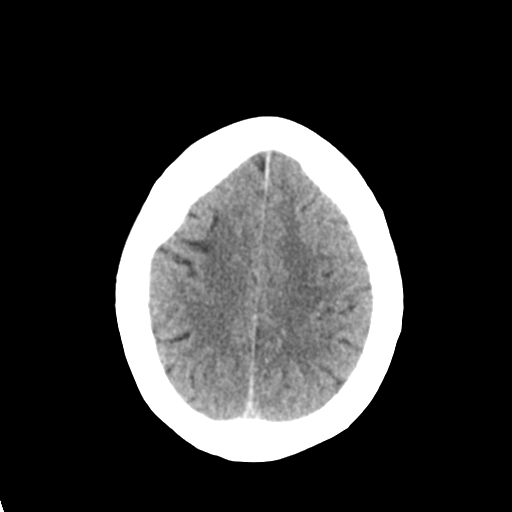
[im 21/29  brain]
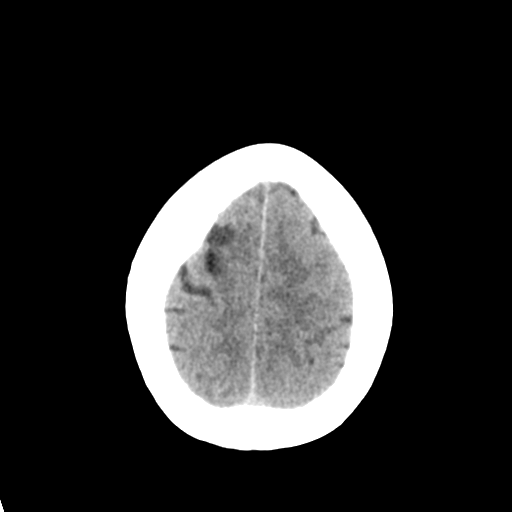
[im 23/29  brain]
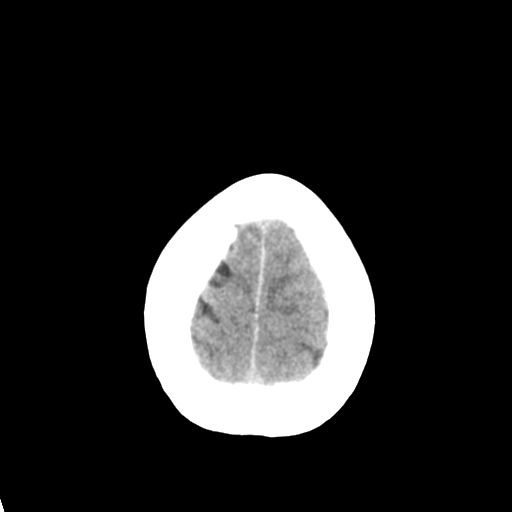
[im 23/29  bone]
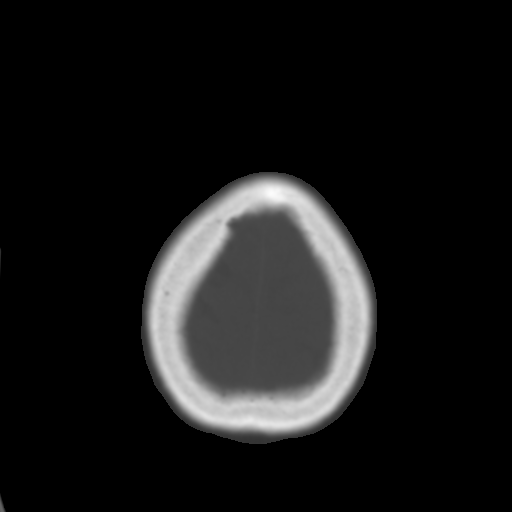
[im 24/29  brain]
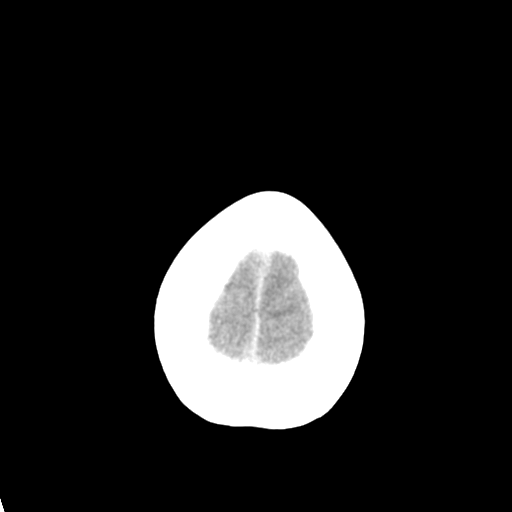
[im 26/29  brain]
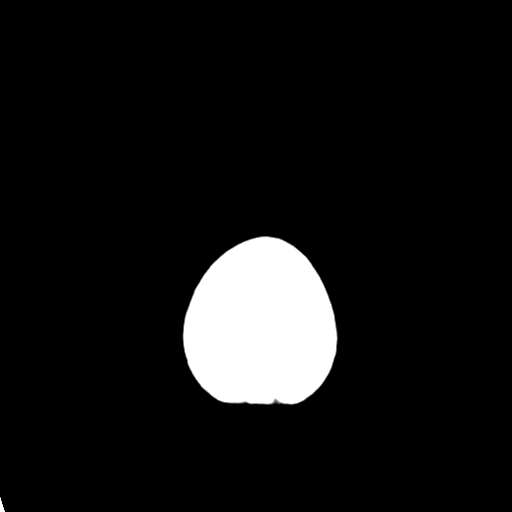
[im 28/29  brain]
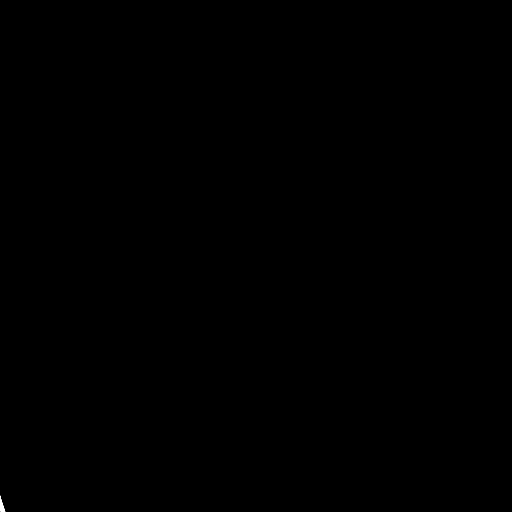

[16 of 29 positions shown; findings below may reference images not displayed]

FINDINGS: Chronic ischemic changes versus remote infarct in the high right
frontal region again noted.

No acute intracranial abnormalities are identified, including mass
lesion or mass effect, hydrocephalus, extra-axial fluid collection,
midline shift, hemorrhage, or acute infarction.

The visualized bony calvarium is unremarkable.
IMPRESSION: No evidence of acute intracranial abnormality.

## 2015-06-03 IMAGING — CR DG CHEST 2V
2 series · 2 of 2 positions shown · non-contrast
Comparison: 11/27/2013

CLINICAL DATA: Cough, fever, leukocytosis

EXAM:
CHEST  2 VIEW

[w chest pa]
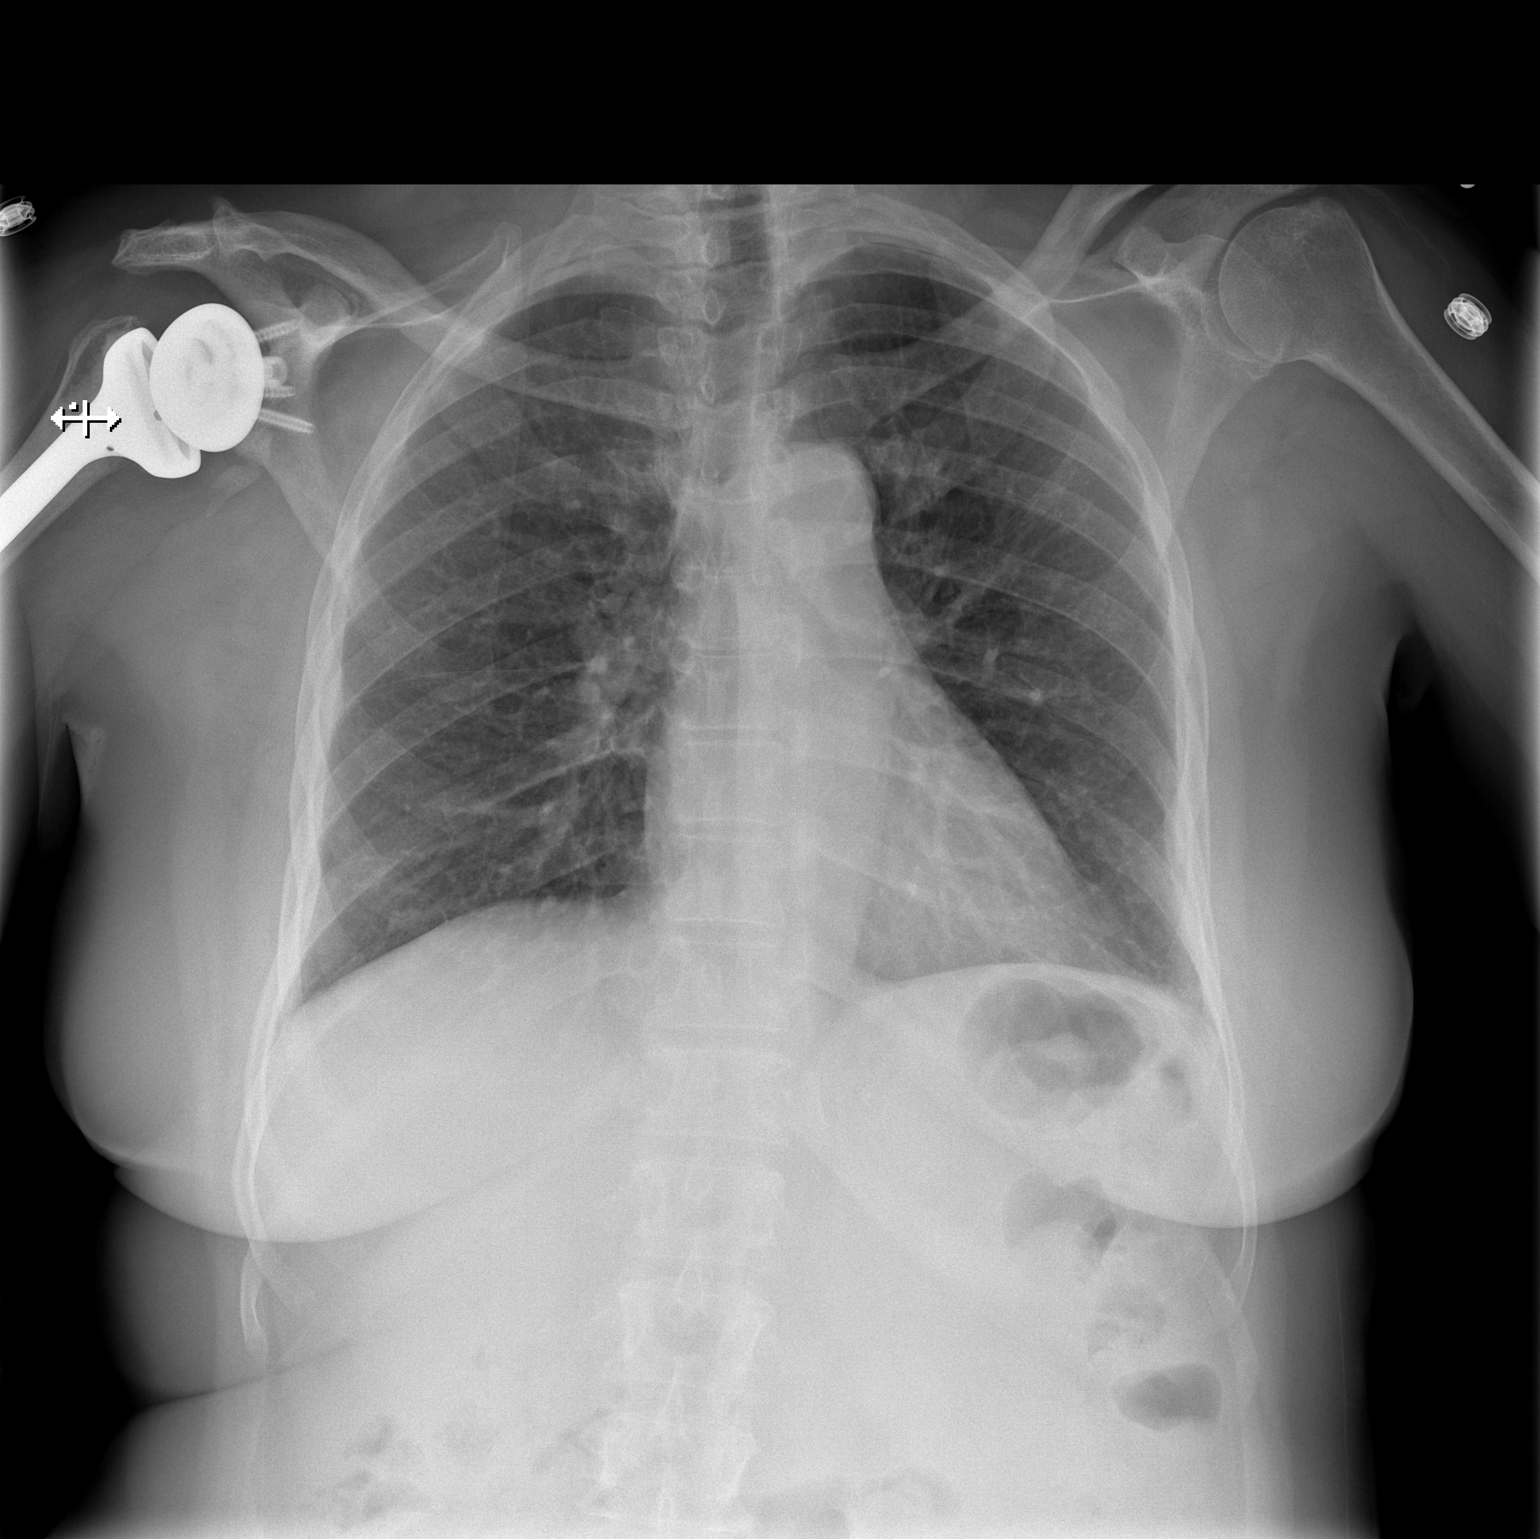

[w chest lat]
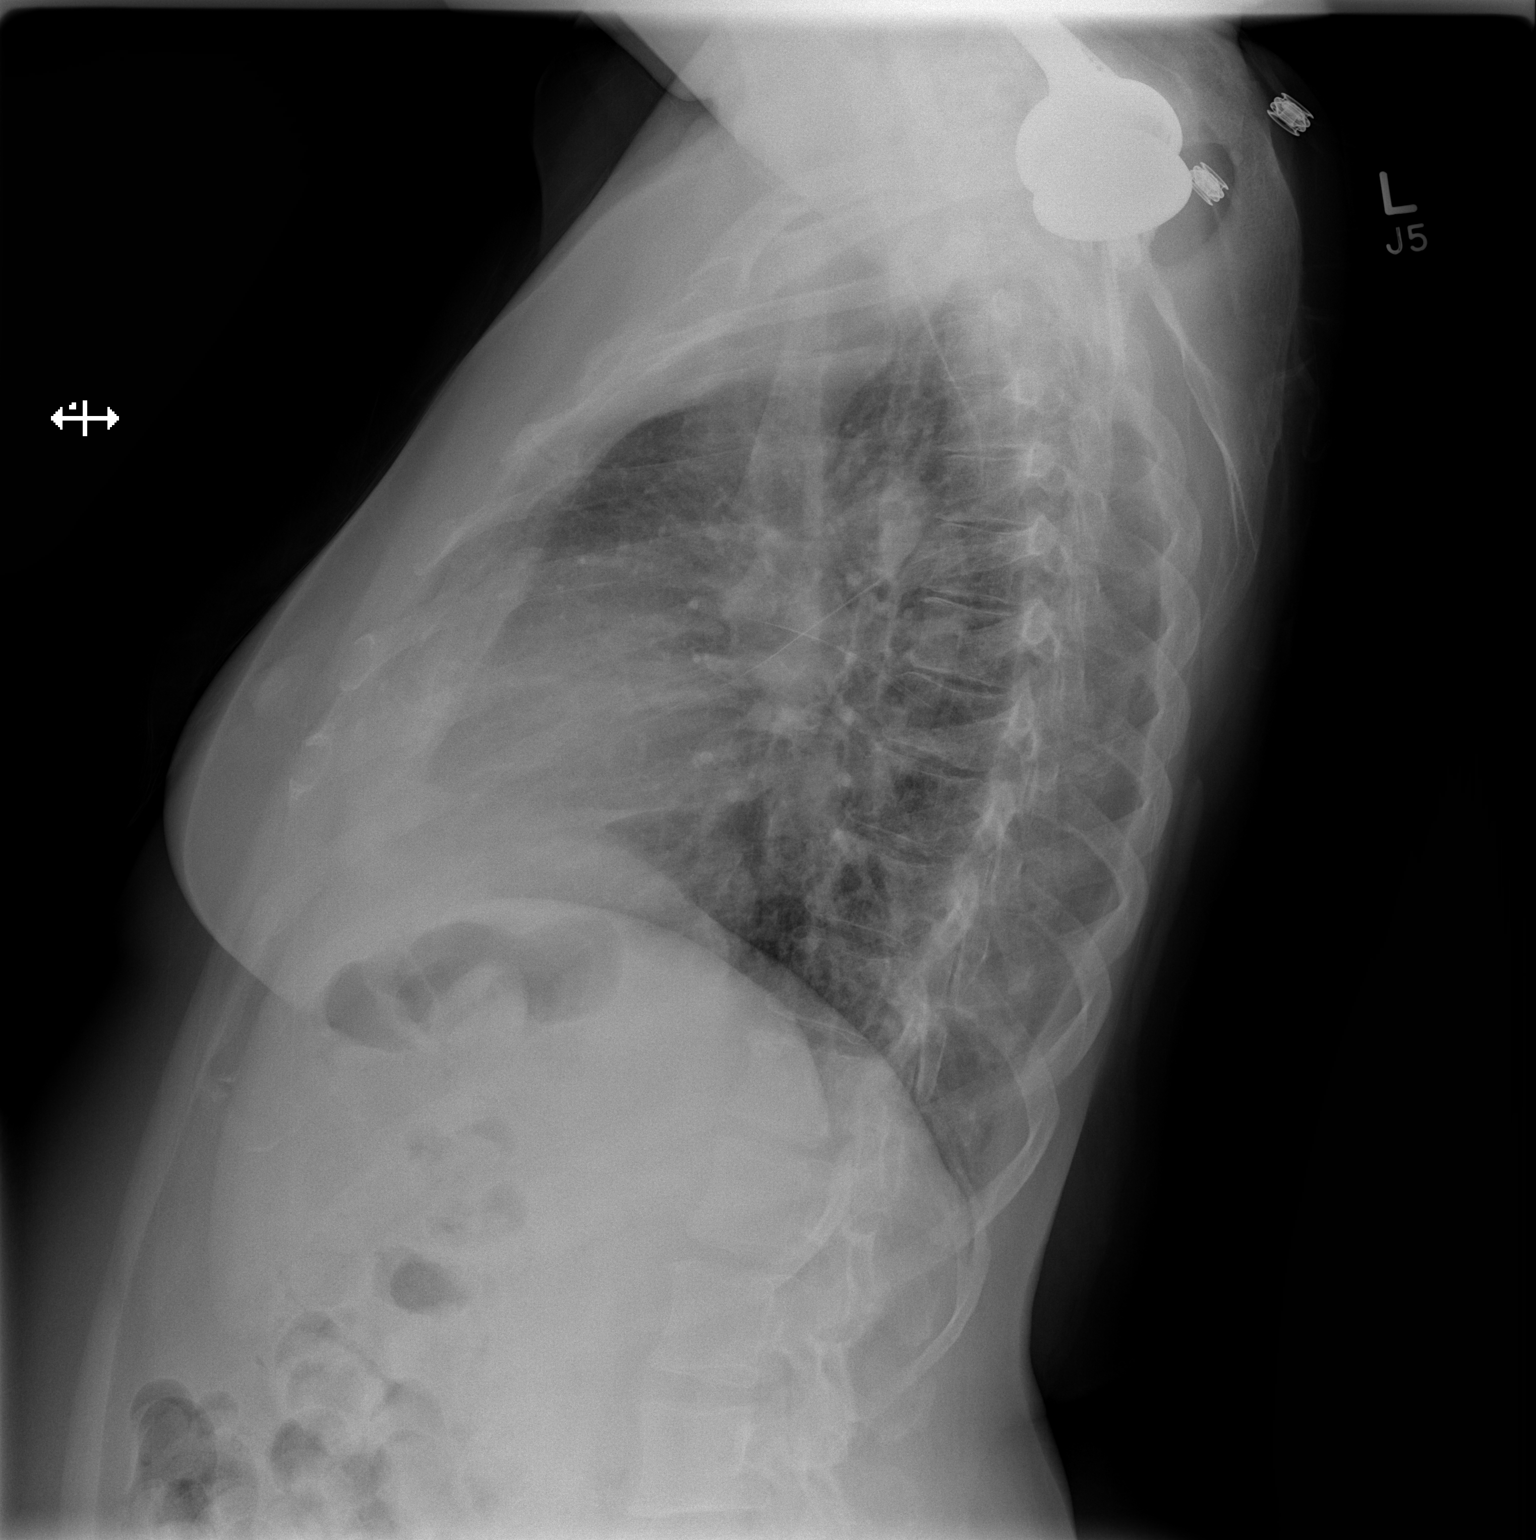

[2 of 2 positions shown; findings below may reference images not displayed]

FINDINGS: Lungs are clear.  No pleural effusion or pneumothorax.

The heart is normal in size.

Mild degenerative changes of the visualized thoracolumbar spine.

Right shoulder arthroplasty.
IMPRESSION: No evidence of acute cardiopulmonary disease.

## 2015-06-19 ENCOUNTER — Ambulatory Visit
Admission: RE | Admit: 2015-06-19 | Discharge: 2015-06-19 | Disposition: A | Discharge status: Home / Self Care | Payer: Medicare Other | Source: Ambulatory Visit

## 2015-06-19 DIAGNOSIS — Z1231 Encounter for screening mammogram for malignant neoplasm of breast: Secondary | ICD-10-CM

## 2024-04-06 ENCOUNTER — Emergency Department (HOSPITAL_BASED_OUTPATIENT_CLINIC_OR_DEPARTMENT_OTHER)
Admission: EM | Admit: 2024-04-06 | Discharge: 2024-04-06 | Disposition: A | Discharge status: Home / Self Care | Attending: Emergency Medicine | Admitting: Emergency Medicine

## 2024-04-06 ENCOUNTER — Other Ambulatory Visit: Payer: Self-pay

## 2024-04-06 ENCOUNTER — Encounter (HOSPITAL_BASED_OUTPATIENT_CLINIC_OR_DEPARTMENT_OTHER): Payer: Self-pay | Admitting: Urology

## 2024-04-06 ENCOUNTER — Emergency Department (HOSPITAL_BASED_OUTPATIENT_CLINIC_OR_DEPARTMENT_OTHER)

## 2024-04-06 DIAGNOSIS — D72829 Elevated white blood cell count, unspecified: Secondary | ICD-10-CM | POA: Diagnosis not present

## 2024-04-06 DIAGNOSIS — J45909 Unspecified asthma, uncomplicated: Secondary | ICD-10-CM | POA: Diagnosis not present

## 2024-04-06 DIAGNOSIS — D649 Anemia, unspecified: Secondary | ICD-10-CM | POA: Insufficient documentation

## 2024-04-06 DIAGNOSIS — Z7951 Long term (current) use of inhaled steroids: Secondary | ICD-10-CM | POA: Diagnosis not present

## 2024-04-06 DIAGNOSIS — I10 Essential (primary) hypertension: Secondary | ICD-10-CM | POA: Diagnosis not present

## 2024-04-06 DIAGNOSIS — Z91040 Latex allergy status: Secondary | ICD-10-CM | POA: Insufficient documentation

## 2024-04-06 DIAGNOSIS — R944 Abnormal results of kidney function studies: Secondary | ICD-10-CM | POA: Diagnosis not present

## 2024-04-06 DIAGNOSIS — Z79899 Other long term (current) drug therapy: Secondary | ICD-10-CM | POA: Insufficient documentation

## 2024-04-06 DIAGNOSIS — M25561 Pain in right knee: Secondary | ICD-10-CM | POA: Insufficient documentation

## 2024-04-06 LAB — CBC WITH DIFFERENTIAL/PLATELET
Abs Immature Granulocytes: 0.03 K/uL (ref 0.00–0.07)
Basophils Absolute: 0.1 K/uL (ref 0.0–0.1)
Basophils Relative: 0 %
Eosinophils Absolute: 0.3 K/uL (ref 0.0–0.5)
Eosinophils Relative: 3 %
HCT: 34.3 % — ABNORMAL LOW (ref 36.0–46.0)
Hemoglobin: 11.7 g/dL — ABNORMAL LOW (ref 12.0–15.0)
Immature Granulocytes: 0 %
Lymphocytes Relative: 21 %
Lymphs Abs: 2.4 K/uL (ref 0.7–4.0)
MCH: 27.7 pg (ref 26.0–34.0)
MCHC: 34.1 g/dL (ref 30.0–36.0)
MCV: 81.1 fL (ref 80.0–100.0)
Monocytes Absolute: 0.9 K/uL (ref 0.1–1.0)
Monocytes Relative: 8 %
Neutro Abs: 7.8 K/uL — ABNORMAL HIGH (ref 1.7–7.7)
Neutrophils Relative %: 68 %
Platelets: 276 K/uL (ref 150–400)
RBC: 4.23 MIL/uL (ref 3.87–5.11)
RDW: 15 % (ref 11.5–15.5)
WBC: 11.5 K/uL — ABNORMAL HIGH (ref 4.0–10.5)
nRBC: 0 % (ref 0.0–0.2)

## 2024-04-06 LAB — BASIC METABOLIC PANEL WITH GFR
Anion gap: 12 (ref 5–15)
BUN: 24 mg/dL — ABNORMAL HIGH (ref 8–23)
CO2: 24 mmol/L (ref 22–32)
Calcium: 9.6 mg/dL (ref 8.9–10.3)
Chloride: 100 mmol/L (ref 98–111)
Creatinine, Ser: 1.13 mg/dL — ABNORMAL HIGH (ref 0.44–1.00)
GFR, Estimated: 51 mL/min — ABNORMAL LOW (ref >60)
Glucose, Bld: 105 mg/dL — ABNORMAL HIGH (ref 70–99)
Potassium: 4.7 mmol/L (ref 3.5–5.1)
Sodium: 136 mmol/L (ref 135–145)

## 2024-04-06 MED ORDER — ACETAMINOPHEN 500 MG PO TABS
1000.0000 mg | ORAL_TABLET | Freq: Once | ORAL | Status: AC
  Administered 2024-04-06: 500 mg via ORAL
  Filled 2024-04-06: qty 2

## 2024-04-06 MED ORDER — CYCLOBENZAPRINE HCL 10 MG PO TABS: 10.0000 mg | ORAL_TABLET | Freq: Two times a day (BID) | ORAL | 0 refills | Status: AC | PRN

## 2024-04-06 NOTE — ED Provider Notes (Signed)
 [facility] Provider Note   CSN: [id number] Arrival date & time: 04/06/24  1648     Patient presents with: Knee Pain   Katrina Gardner is a 73 y.o. female with PMHx OA, Asthma, GERD, HTN who presents to ED concerned for right knee pain x1 month. Pain worse at night when she is laying down. Patient takes daily 81mg  ASA - denies any other blood thinners. Tylenol  is no longer providing much relief.   Denies fever, chest pain, dyspnea, cough, nausea, vomiting, diarrhea.     Knee Pain      Prior to Admission medications   Medication Sig Start Date End Date Taking? Authorizing Provider  cyclobenzaprine  (FLEXERIL ) 10 MG tablet Take 1 tablet (10 mg total) by mouth 2 (two) times daily as needed for up to 5 doses for muscle spasms. 04/06/24  Yes Shukri Nistler, Nidia F, PA-C  albuterol  (PROVENTIL  HFA;VENTOLIN  HFA) 108 (90 BASE) MCG/ACT inhaler Inhale 2 puffs into the lungs every 6 (six) hours as needed for wheezing or shortness of breath.    [provider]  amLODipine  (NORVASC ) 5 MG tablet Take 5 mg by mouth daily.    [provider]  beclomethasone (QVAR) 80 MCG/ACT inhaler Inhale 2 puffs into the lungs 2 (two) times daily as needed (for shortness of bretah).    [provider]  chlorhexidine  (PERIDEX ) 0.12 % solution Use as directed 15 mLs in the mouth or throat 2 (two) times daily. 11/28/13   Alvia Doyal HERO, MD  HYDROcodone -acetaminophen  (HYCET) 7.5-325 mg/15 ml solution Take 10 mLs by mouth 4 (four) times daily as needed for moderate pain. 11/27/13   Nivia Colon, PA-C  ranitidine (ZANTAC) 150 MG tablet Take 150 mg by mouth as needed for heartburn.    [provider]  UNKNOWN TO PATIENT Apply 1 application topically 2 (two) times daily as needed (for eczema). Eczema cream    [provider]    Allergies: Penicillins and Latex    Review of Systems  Musculoskeletal:        Knee pain    Updated  Vital Signs BP (!) 161/74 (BP Location: Right Arm)   Pulse 73   Temp 98.4 F (36.9 C)   Resp 20   Ht 5' 3 (1.6 m)   Wt 77 kg   SpO2 96%   BMI 30.07 kg/m   Physical Exam Vitals and nursing note reviewed.  Constitutional:      General: She is not in acute distress.    Appearance: She is not ill-appearing or toxic-appearing.  HENT:     Head: Normocephalic and atraumatic.  Eyes:     General: No scleral icterus.       Right eye: No discharge.        Left eye: No discharge.     Conjunctiva/sclera: Conjunctivae normal.  Cardiovascular:     Rate and Rhythm: Normal rate.  Pulmonary:     Effort: Pulmonary effort is normal.  Abdominal:     General: Abdomen is flat.  Musculoskeletal:     Comments: Right knee: No swelling, erythema, increased warmth appreciated.  Tenderness to palpation along anterior joint line.  Active ROM intact.  +2 pedal pulse.  Area nontense.  Sensation light touch intact.  Skin:    General: Skin is warm and dry.  Neurological:     General: No focal deficit present.     Mental Status: She is alert and oriented to person, place, and time. Mental  status is at baseline.  Psychiatric:        Mood and Affect: Mood normal.        Behavior: Behavior normal.     (all labs ordered are listed, but only abnormal results are displayed) Labs Reviewed  BASIC METABOLIC PANEL WITH GFR - Abnormal; Notable for the following components:      Result Value   Glucose, Bld 105 (*)    BUN 24 (*)    Creatinine, Ser 1.13 (*)    GFR, Estimated 51 (*)    All other components within normal limits  CBC WITH DIFFERENTIAL/PLATELET - Abnormal; Notable for the following components:   WBC 11.5 (*)    Hemoglobin 11.7 (*)    HCT 34.3 (*)    Neutro Abs 7.8 (*)    All other components within normal limits    EKG: None  Radiology: US  Venous Img Lower Right (DVT Study) Result Date: 04/06/2024 CLINICAL DATA:  Right knee pain x1 month. EXAM: RIGHT LOWER EXTREMITY VENOUS DOPPLER  ULTRASOUND TECHNIQUE: Gray-scale sonography with graded compression, as well as color Doppler and duplex ultrasound were performed to evaluate the lower extremity deep venous systems from the level of the common femoral vein and including the common femoral, femoral, profunda femoral, popliteal and calf veins including the posterior tibial, peroneal and gastrocnemius veins when visible. The superficial great saphenous vein was also interrogated. Spectral Doppler was utilized to evaluate flow at rest and with distal augmentation maneuvers in the common femoral, femoral and popliteal veins. COMPARISON:  None Available. FINDINGS: Contralateral Common Femoral Vein: Respiratory phasicity is normal and symmetric with the symptomatic side. No evidence of thrombus. Normal compressibility. Common Femoral Vein: No evidence of thrombus. Normal compressibility, respiratory phasicity and response to augmentation. Saphenofemoral Junction: No evidence of thrombus. Normal compressibility and flow on color Doppler imaging. Profunda Femoral Vein: No evidence of thrombus. Normal compressibility and flow on color Doppler imaging. Femoral Vein: No evidence of thrombus. Normal compressibility, respiratory phasicity and response to augmentation. Popliteal Vein: No evidence of thrombus. Normal compressibility, respiratory phasicity and response to augmentation. Calf Veins: There is limited visualization of the RIGHT peroneal vein. No evidence of thrombus within the RIGHT posterior tibial vein. Normal compressibility and flow on color Doppler imaging. Superficial Great Saphenous Vein: No evidence of thrombus. Normal compressibility. Venous Reflux:  None. Other Findings:  None. IMPRESSION: Limited evaluation of the RIGHT peroneal vein, without evidence of deep venous thrombosis within the RIGHT lower extremity. Electronically Signed   By: Suzen Dials M.D.   On: 04/06/2024 21:42   DG Knee Complete 4 Views Right Result Date:  04/06/2024 CLINICAL DATA:  Right knee pain for 1 month. EXAM: RIGHT KNEE - COMPLETE 4+ VIEW COMPARISON:  None Available. FINDINGS: No evidence of fracture, dislocation, or joint effusion. There is mild patellofemoral compartment joint space narrowing and osteophyte formation. There superior and inferior patellar spurs. There are minimal degenerative osteophytes of the medial and lateral compartment of the knee. Soft tissues are unremarkable. IMPRESSION: Mild tricompartmental degenerative changes of the right knee. Electronically Signed   By: Greig Pique M.D.   On: 04/06/2024 17:22     Procedures   Medications Ordered in the ED  acetaminophen  (TYLENOL ) tablet 1,000 mg (500 mg Oral Given 04/06/24 2123)                                    Medical Decision Making Amount  and/or Complexity of Data Reviewed Labs: ordered. Radiology: ordered.  Risk OTC drugs.   This patient presents to the ED for concern of knee pain, this involves an extensive number of treatment options, and is a complaint that carries with it a high risk of complications and morbidity.  The differential diagnosis includes hemarthrosis, gout, septic joint, fracture, tendonitis, muscle strain, bursitis, compartment syndrome   Co morbidities that complicate the patient evaluation  OA, Asthma, GERD, HTN   Additional history obtained:  No PCP listed in chart.   Problem List / ED Course / Critical interventions / Medication management  Patient presents ED concern for right knee pain that radiates posteriorly.  Physical exam with tenderness outpatient of anterior joint line and posterior thigh.  Rest of physical exam reassuring.  Patient afebrile with stable vitals. I Ordered, and personally interpreted labs.  CBC with mild leukocytosis at 11.5.  There is also mild anemia with hemoglobin 11.7.  BMP with slight elevation in BUN/creatinine at 20/1.13. I ordered imaging studies including DVT US , right knee x-ray. I  independently visualized and interpreted imaging. I agree with the radiologist interpretation of arthritis and no acute abnormalities. Shared results with patient.  Answered all questions.  Recommended following up with orthopedic.  Also recommended staying plenty hydrated and following up with primary care for repeat kidney function test later this week.  Patient agreeable with plan. Staffed with Dr. Patt who agrees with plan. I have reviewed the patients home medicines and have made adjustments as needed The patient has been appropriately medically screened and/or stabilized in the ED. I have low suspicion for any other emergent medical condition which would require further screening, evaluation or treatment in the ED or require inpatient management. At time of discharge the patient is hemodynamically stable and in no acute distress. I have discussed work-up results and diagnosis with patient and answered all questions. Patient is agreeable with discharge plan. We discussed strict return precautions for returning to the emergency department and they verbalized understanding.     Social Determinants of Health:  Geriatric     Final diagnoses:  Acute pain of right knee    ED Discharge Orders          Ordered    cyclobenzaprine  (FLEXERIL ) 10 MG tablet  2 times daily PRN        04/06/24 2209               Hoy Nidia FALCON, PA-C 04/06/24 2211    Patt Alm Macho, MD 04/07/24 [phone]

## 2024-04-06 NOTE — ED Notes (Signed)
Patient verbalizes understanding of discharge instructions. Opportunity for questioning and answers were provided. Armband removed by staff, pt discharged from ED. Ambulated out to lobby with family ? ?

## 2024-04-06 NOTE — ED Triage Notes (Signed)
 Pt states right knee pain x 1 month  Pain worse with ROM  Also states muscle to posterior leg is hurting

## 2024-04-06 NOTE — Discharge Instructions (Addendum)
 As discussed, please follow-up with primary care and orthopedics.  Seek emergency care experiencing any new or worsening symptoms.
